# Patient Record
Sex: Female | Born: 1965 | Race: Black or African American | Hispanic: No | Marital: Married | Smoking: Never smoker
Health system: Southern US, Community
[De-identification: ages and names within clinical notes are randomized; demographics above are authoritative.]

## PROBLEM LIST (undated history)

## (undated) DIAGNOSIS — Z923 Personal history of irradiation: Secondary | ICD-10-CM

## (undated) DIAGNOSIS — R112 Nausea with vomiting, unspecified: Secondary | ICD-10-CM

## (undated) DIAGNOSIS — C801 Malignant (primary) neoplasm, unspecified: Secondary | ICD-10-CM

## (undated) DIAGNOSIS — Z9889 Other specified postprocedural states: Secondary | ICD-10-CM

## (undated) DIAGNOSIS — T8859XA Other complications of anesthesia, initial encounter: Secondary | ICD-10-CM

## (undated) DIAGNOSIS — T4145XA Adverse effect of unspecified anesthetic, initial encounter: Secondary | ICD-10-CM

## (undated) HISTORY — PX: APPENDECTOMY: SHX54

## (undated) HISTORY — PX: TUBAL LIGATION: SHX77

## (undated) HISTORY — PX: CHOLECYSTECTOMY: SHX55

---

## 2016-12-19 ENCOUNTER — Other Ambulatory Visit: Payer: Self-pay | Admitting: Specialist

## 2016-12-19 DIAGNOSIS — N6310 Unspecified lump in the right breast, unspecified quadrant: Secondary | ICD-10-CM

## 2016-12-19 DIAGNOSIS — C801 Malignant (primary) neoplasm, unspecified: Secondary | ICD-10-CM

## 2016-12-19 HISTORY — DX: Malignant (primary) neoplasm, unspecified: C80.1

## 2016-12-25 ENCOUNTER — Ambulatory Visit
Admission: RE | Admit: 2016-12-25 | Discharge: 2016-12-25 | Disposition: A | Discharge status: Home / Self Care | Payer: BLUE CROSS/BLUE SHIELD | Source: Ambulatory Visit | Attending: Specialist | Admitting: Specialist

## 2016-12-25 DIAGNOSIS — N6310 Unspecified lump in the right breast, unspecified quadrant: Secondary | ICD-10-CM

## 2017-01-06 ENCOUNTER — Ambulatory Visit: Payer: Self-pay | Admitting: Surgery

## 2017-01-06 DIAGNOSIS — Z170 Estrogen receptor positive status [ER+]: Principal | ICD-10-CM

## 2017-01-06 DIAGNOSIS — C50411 Malignant neoplasm of upper-outer quadrant of right female breast: Secondary | ICD-10-CM

## 2017-01-06 NOTE — H&P (Signed)
Katelyn Villanueva 01/06/2017 11:31 AM Location: [facility] Patient #: [id number] DOB: 05-03-1966 Married / Language: English / Race: Black or African American Female  History of Present Illness Marcello Moores A. Abimbola Aki MD; 01/06/2017 5:19 PM) Patient words: Patient said at the request of Dr. Noel Christmas for right breast cancer. Patient underwent screening mammogram which showed a 1.5 cm right breast mass upper outer quadrant. 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 This was 0 positive. A positive HER-2/neu negative with a Ki-67 of 3%. Patient had a maternal age with breast cancer. She was in her 35s when she was diagnosed. Patient has no other symptoms. Patient denies breast pain nipple discharge further problem with her breasts.                                CLINICAL DATA: Status post ultrasound-guided core needle biopsy of the 1.5 cm mass in the 10 o'clock position of the right breast.  EXAM: DIAGNOSTIC RIGHT MAMMOGRAM POST ULTRASOUND BIOPSY  COMPARISON: Previous exam(s).  FINDINGS: Mammographic images were obtained following ultrasound guided biopsy of the recently demonstrated 1.5 cm mass in the 10 o'clock position of the right breast. These demonstrate a ribbon shaped biopsy marker clip within the biopsied mass.  IMPRESSION: Appropriate clip deployment following ultrasound-guided core needle biopsy.  Final Assessment: Post Procedure Mammograms for Marker Placement   Electronically Signed By: Claudie Revering M.D. On: 12/25/2016 13:57             Patient: Katelyn Villanueva Collected: 12/25/2016 Client: [facility] Accession: [id number] Received: 12/25/2016 Claudie Revering, MD DOB: 03-19-1966 Age: 49 Gender: F Reported: 12/26/2016 [place] Patient Ph: [phone] MRN #: [id number] [place], [place] [address] Client Acc#: Chart #: [id number] Phone: [phone] Fax: CC: REPORT OF  SURGICAL PATHOLOGY ADDITIONAL INFORMATION: PROGNOSTIC INDICATORS Results: IMMUNOHISTOCHEMICAL AND MORPHOMETRIC ANALYSIS PERFORMED MANUALLY Estrogen Receptor: 90%, POSITIVE, STRONG STAINING INTENSITY Progesterone Receptor: 90%, POSITIVE, STRONG STAINING INTENSITY Proliferation Marker Ki67: 3% REFERENCE RANGE ESTROGEN RECEPTOR NEGATIVE 0% POSITIVE =>1% REFERENCE RANGE PROGESTERONE RECEPTOR NEGATIVE 0% POSITIVE =>1% All controls stained appropriately Enid Cutter MD Pathologist, Electronic Signature ( Signed 12/30/2016) FLUORESCENCE IN-SITU HYBRIDIZATION Results: HER2 - NEGATIVE RATIO OF HER2/CEP17 SIGNALS 1.38 AVERAGE HER2 COPY NUMBER PER CELL 2.55 Reference Range: NEGATIVE HER2/CEP17 Ratio <2.0 and average HER2 copy number <4.0 EQUIVOCAL HER2/CEP17 Ratio <2.0 and average HER2 copy number >=4.0 and <6.0 1 of 3 FINAL for [name], [name] ([id number]) ADDITIONAL INFORMATION:(continued) POSITIVE HER2/CEP17 Ratio >=2.0 or <2.0 and average HER2 copy number >=6.0 Enid Cutter MD Pathologist, Electronic Signature ( Signed 12/30/2016) FINAL DIAGNOSIS Diagnosis Breast, right, needle core biopsy, 10:00 o'clock - 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 Ductal carcinoma in situ, intermediate nuclear grade, cribriform pattern with calcifications is associated with the invasive component. Prognostic markers (ER/PR/ki-67/HER2-FISH) are pending and will be reported in an addendum. Dr. Lyndon Code has reviewed the case and agrees with above diagnosis. These results were called to [facility] on December 26, 2016. Thressa Sheller MD Pathologist, Electronic Signature (Case signed 12/26/2016) Specimen Gross and Clinical Information Specimen Comment In formalin: 1:15 PM; 1.5cm mass Specimen(s)  Obtained: Breast, right, needle core biopsy, 10:00 o'clock Specimen Clinical Information IMC.  The patient is a 51 year old  female.   Past Surgical History Katelyn Villanueva, [place]; 01/06/2017 11:32 AM) Breast Biopsy Right. Gallbladder Surgery - Open Oral Surgery  Diagnostic Studies History Katelyn Villanueva, [place]; 01/06/2017 11:32 AM) Colonoscopy never Mammogram within last year  Allergies Katelyn Villanueva, RMA; 01/06/2017 11:32 AM) No Known Allergies 01/06/2017  Medication History Katelyn Villanueva, RMA; 01/06/2017 11:33 AM) No Current Medications Medications Reconciled  Social History Katelyn Villanueva, [place]; 01/06/2017 11:32 AM) No alcohol use No caffeine use No drug use Tobacco use Never smoker.  Family History Katelyn Villanueva, [place]; 01/06/2017 11:32 AM) Breast Cancer Family Members In General. Hypertension Mother. Migraine Headache Mother. Seizure disorder Mother.  Pregnancy / Birth History Katelyn Villanueva, [place]; 01/06/2017 11:32 AM) Age at menarche 77 years. Contraceptive History Oral contraceptives. Gravida 3 Maternal age 63-25 Para 3 Regular periods  Other Problems Katelyn Villanueva, [place]; 01/06/2017 11:32 AM) Breast Cancer Cholelithiasis     Review of Systems Katelyn Villanueva RMA; 01/06/2017 11:32 AM) General Not Present- Appetite Loss, Chills, Fatigue, Fever, Night Sweats, Weight Gain and Weight Loss. Skin Not Present- Change in Wart/Mole, Dryness, Hives, Jaundice, New Lesions, Non-Healing Wounds, Rash and Ulcer. HEENT Not Present- Earache, Hearing Loss, Hoarseness, Nose Bleed, Oral Ulcers, Ringing in the Ears, Seasonal Allergies, Sinus Pain, Sore Throat, Visual Disturbances, Wears glasses/contact lenses and Yellow Eyes. Breast Present- Breast Mass. Not Present- Breast Pain, Nipple Discharge and Skin Changes. Cardiovascular Present- Swelling of Extremities. Not Present- Chest Pain, Difficulty Breathing Lying Down, Leg Cramps,  Palpitations, Rapid Heart Rate and Shortness of Breath. Gastrointestinal Not Present- Abdominal Pain, Bloating, Bloody Stool, Change in Bowel Habits, Chronic diarrhea, Constipation, Difficulty Swallowing, Excessive gas, Gets full quickly at meals, Hemorrhoids, Indigestion, Nausea, Rectal Pain and Vomiting. Female Genitourinary Not Present- Frequency, Nocturia, Painful Urination, Pelvic Pain and Urgency. Musculoskeletal Not Present- Back Pain, Joint Pain, Joint Stiffness, Muscle Pain, Muscle Weakness and Swelling of Extremities. Neurological Not Present- Decreased Memory, Fainting, Headaches, Numbness, Seizures, Tingling, Tremor, Trouble walking and Weakness. Psychiatric Not Present- Anxiety, Bipolar, Change in Sleep Pattern, Depression, Fearful and Frequent crying. Endocrine Not Present- Cold Intolerance, Excessive Hunger, Hair Changes, Heat Intolerance, Hot flashes and New Diabetes. Hematology Not Present- Blood Thinners, Easy Bruising, Excessive bleeding, Gland problems, HIV and Persistent Infections.  Vitals Katelyn Villanueva RMA; 01/06/2017 11:33 AM) 01/06/2017 11:33 AM Weight: 205 lb Height: 66in Body Surface Area: 2.02 m Body Mass Index: 33.09 kg/m  Temp.: 97.69F  Pulse: 100 (Regular)  BP: 160/90 (Sitting, Left Arm, Standard)      Physical Exam (Maurisio Ruddy A. Uriel Dowding MD; 01/06/2017 5:19 PM)  General Mental Status-Alert. General Appearance-Consistent with stated age. Hydration-Well hydrated. Voice-Normal.  Head and Neck Head-normocephalic, atraumatic with no lesions or palpable masses. Trachea-midline. Thyroid Gland Characteristics - normal size and consistency.  Eye Eyeball - Bilateral-Extraocular movements intact. Sclera/Conjunctiva - Bilateral-No scleral icterus.  Chest and Lung Exam Chest and lung exam reveals -quiet, even and easy respiratory effort with no use of accessory muscles and on auscultation, normal breath sounds, no adventitious  sounds and normal vocal resonance. Inspection Chest Wall - Normal. Back - normal.  Breast Breast - Left-Symmetric, Non Tender, No Biopsy scars, no Dimpling, No Inflammation, No Lumpectomy scars, No Mastectomy scars, No Peau d' Orange. Breast - Right-Symmetric, Non Tender, No Biopsy scars, no Dimpling, No Inflammation, No Lumpectomy scars, No Mastectomy scars, No Peau d' Orange. Breast Lump-No Palpable Breast Mass.  Cardiovascular Cardiovascular examination reveals -normal heart sounds, regular rate and rhythm with no murmurs and normal pedal pulses bilaterally.  Abdomen Inspection Inspection of the abdomen  reveals - No Hernias. Skin - Scar - no surgical scars. Palpation/Percussion Palpation and Percussion of the abdomen reveal - Soft, Non Tender, No Rebound tenderness, No Rigidity (guarding) and No hepatosplenomegaly. Auscultation Auscultation of the abdomen reveals - Bowel sounds normal.  Neurologic Neurologic evaluation reveals -alert and oriented x 3 with no impairment of recent or remote memory. Mental Status-Normal.  Musculoskeletal Normal Exam - Left-Upper Extremity Strength Normal and Lower Extremity Strength Normal. Normal Exam - Right-Upper Extremity Strength Normal and Lower Extremity Strength Normal.  Lymphatic Head & Neck  General Head & Neck Lymphatics: Bilateral - Description - Normal. Axillary  General Axillary Region: Bilateral - Description - Normal. Tenderness - Non Tender. Femoral & Inguinal  Generalized Femoral & Inguinal Lymphatics: Bilateral - Description - Normal. Tenderness - Non Tender.    Assessment & Plan (Tyden Kann A. Alianny Toelle MD; 01/06/2017 5:21 PM)  BREAST CANCER, RIGHT (C50.911) Impression: Discussed surgical options. Refer to medical radiation oncology Risk of lumpectomy include bleeding, infection, seroma, more surgery, use of seed/wire, wound care, cosmetic deformity and the need for other treatments, death , blood clots, death.  Pt agrees to proceed. Risk of sentinel lymph node mapping include bleeding, infection, lymphedema, shoulder pain. stiffness, dye allergy. cosmetic deformity , blood clots, death, need for more surgery. Pt agres to proceed. . Patient desires lumpectomy and we set her up for right breast partial mastectomy seed ocalization and right axillary sentinel lymph node mapping.  Current Plans We discussed the staging and pathophysiology of breast cancer. We discussed all of the different options for treatment for breast cancer including surgery, chemotherapy, radiation therapy, Herceptin, and antiestrogen therapy. We discussed a sentinel lymph node biopsy as she does not appear to having lymph node involvement right now. We discussed the performance of that with injection of radioactive tracer and blue dye. We discussed that she would have an incision underneath her axillary hairline. We discussed that there is a bout a 10-20% chance of having a positive node with a sentinel lymph node biopsy and we will await the permanent pathology to make any other first further decisions in terms of her treatment. One of these options might be to return to the operating room to perform an axillary lymph node dissection. We discussed about a 1-2% risk lifetime of chronic shoulder pain as well as lymphedema associated with a sentinel lymph node biopsy. We discussed the options for treatment of the breast cancer which included lumpectomy versus a mastectomy. We discussed the performance of the lumpectomy with a wire placement. We discussed a 10-20% chance of a positive margin requiring reexcision in the operating room. We also discussed that she may need radiation therapy or antiestrogen therapy or both if she undergoes lumpectomy. We discussed the mastectomy and the postoperative care for that as well. We discussed that there is no difference in her survival whether she undergoes lumpectomy with radiation therapy or antiestrogen  therapy versus a mastectomy. There is a slight difference in the local recurrence rate being 3-5% with lumpectomy and about 1% with a mastectomy. We discussed the risks of operation including bleeding, infection, possible reoperation. She understands her further therapy will be based on what her stages at the time of her operation.  You are being scheduled for surgery- Our schedulers will call you.  You should hear from our office's scheduling department within 5 working days about the location, date, and time of surgery. We try to make accommodations for patient's preferences in scheduling surgery, but sometimes the OR schedule  or the surgeon's schedule prevents [place] from making those accommodations.  If you have not heard from our office [phone]) in 5 working days, call the office and ask for your surgeon's nurse.  If you have other questions about your diagnosis, plan, or surgery, call the office and ask for your surgeon's nurse.  Pt Education - [facility] Breast Cancer Information Given - [facility] "Breast Journey" Package Pt Education - [facility] breast cancer surgery: discussed with patient and provided information. Pt Education - [facility] (After Breast Cancer) Class Info: discussed with patient and provided information.

## 2017-01-08 ENCOUNTER — Other Ambulatory Visit: Payer: Self-pay | Admitting: Surgery

## 2017-01-08 DIAGNOSIS — C50411 Malignant neoplasm of upper-outer quadrant of right female breast: Secondary | ICD-10-CM

## 2017-01-08 DIAGNOSIS — Z170 Estrogen receptor positive status [ER+]: Principal | ICD-10-CM

## 2017-01-09 ENCOUNTER — Encounter: Payer: Self-pay | Admitting: Radiation Oncology

## 2017-01-12 ENCOUNTER — Other Ambulatory Visit: Payer: Self-pay | Admitting: Oncology

## 2017-01-12 ENCOUNTER — Encounter (HOSPITAL_BASED_OUTPATIENT_CLINIC_OR_DEPARTMENT_OTHER): Payer: Self-pay | Admitting: *Deleted

## 2017-01-13 ENCOUNTER — Encounter: Payer: Self-pay | Admitting: Hematology and Oncology

## 2017-01-13 ENCOUNTER — Encounter (HOSPITAL_BASED_OUTPATIENT_CLINIC_OR_DEPARTMENT_OTHER)
Admission: RE | Admit: 2017-01-13 | Discharge: 2017-01-13 | Disposition: A | Discharge status: Home / Self Care | Payer: BLUE CROSS/BLUE SHIELD | Source: Ambulatory Visit | Attending: Surgery | Admitting: Surgery

## 2017-01-13 ENCOUNTER — Telehealth: Payer: Self-pay | Admitting: Hematology and Oncology

## 2017-01-13 LAB — COMPREHENSIVE METABOLIC PANEL
ALT: 23 U/L (ref 14–54)
ANION GAP: 4 — AB (ref 5–15)
AST: 18 U/L (ref 15–41)
Albumin: 3.9 g/dL (ref 3.5–5.0)
Alkaline Phosphatase: 65 U/L (ref 38–126)
BILIRUBIN TOTAL: 0.9 mg/dL (ref 0.3–1.2)
BUN: 8 mg/dL (ref 6–20)
CO2: 28 mmol/L (ref 22–32)
Calcium: 10.7 mg/dL — ABNORMAL HIGH (ref 8.9–10.3)
Chloride: 104 mmol/L (ref 101–111)
Creatinine, Ser: 0.63 mg/dL (ref 0.44–1.00)
GFR calc Af Amer: >60 mL/min (ref >60)
Glucose, Bld: 101 mg/dL — ABNORMAL HIGH (ref 65–99)
POTASSIUM: 4.8 mmol/L (ref 3.5–5.1)
Sodium: 136 mmol/L (ref 135–145)
TOTAL PROTEIN: 7.1 g/dL (ref 6.5–8.1)

## 2017-01-13 LAB — CBC WITH DIFFERENTIAL/PLATELET
Basophils Absolute: 0 10*3/uL (ref 0.0–0.1)
Basophils Relative: 0 %
Eosinophils Absolute: 0.2 10*3/uL (ref 0.0–0.7)
Eosinophils Relative: 3 %
HEMATOCRIT: 41.5 % (ref 36.0–46.0)
Hemoglobin: 13.8 g/dL (ref 12.0–15.0)
LYMPHS PCT: 54 %
Lymphs Abs: 3.7 10*3/uL (ref 0.7–4.0)
MCH: 29.1 pg (ref 26.0–34.0)
MCHC: 33.3 g/dL (ref 30.0–36.0)
MCV: 87.4 fL (ref 78.0–100.0)
MONO ABS: 0.6 10*3/uL (ref 0.1–1.0)
MONOS PCT: 9 %
NEUTROS ABS: 2.4 10*3/uL (ref 1.7–7.7)
Neutrophils Relative %: 34 %
Platelets: 239 10*3/uL (ref 150–400)
RBC: 4.75 MIL/uL (ref 3.87–5.11)
RDW: 13 % (ref 11.5–15.5)
WBC: 6.9 10*3/uL (ref 4.0–10.5)

## 2017-01-13 NOTE — Progress Notes (Signed)
Boost drink given with instructions to complete by 0400 dos, pt verbalized understanding.

## 2017-01-13 NOTE — Telephone Encounter (Signed)
Appt has been scheduled for the pt to see Dr. Lindi Adie on 7/9 at 345pm. Pt agreed to the appt date and time. Demographics verified. Letter mailed to the pt.

## 2017-01-15 ENCOUNTER — Ambulatory Visit
Admission: RE | Admit: 2017-01-15 | Discharge: 2017-01-15 | Disposition: A | Discharge status: Home / Self Care | Payer: BLUE CROSS/BLUE SHIELD | Source: Ambulatory Visit | Attending: Surgery | Admitting: Surgery

## 2017-01-15 DIAGNOSIS — C50411 Malignant neoplasm of upper-outer quadrant of right female breast: Secondary | ICD-10-CM

## 2017-01-15 DIAGNOSIS — Z170 Estrogen receptor positive status [ER+]: Principal | ICD-10-CM

## 2017-01-18 HISTORY — PX: BREAST LUMPECTOMY: SHX2

## 2017-01-19 ENCOUNTER — Encounter (HOSPITAL_BASED_OUTPATIENT_CLINIC_OR_DEPARTMENT_OTHER)
Admission: RE | Disposition: A | Discharge status: Home / Self Care | Payer: Self-pay | Source: Ambulatory Visit | Attending: Surgery

## 2017-01-19 ENCOUNTER — Ambulatory Visit (HOSPITAL_BASED_OUTPATIENT_CLINIC_OR_DEPARTMENT_OTHER): Payer: BLUE CROSS/BLUE SHIELD | Admitting: Anesthesiology

## 2017-01-19 ENCOUNTER — Encounter (HOSPITAL_BASED_OUTPATIENT_CLINIC_OR_DEPARTMENT_OTHER): Payer: Self-pay

## 2017-01-19 ENCOUNTER — Ambulatory Visit
Admission: RE | Admit: 2017-01-19 | Discharge: 2017-01-19 | Disposition: A | Discharge status: Home / Self Care | Payer: BLUE CROSS/BLUE SHIELD | Source: Ambulatory Visit | Attending: Surgery | Admitting: Surgery

## 2017-01-19 ENCOUNTER — Encounter (HOSPITAL_COMMUNITY)
Admission: RE | Admit: 2017-01-19 | Discharge: 2017-01-19 | Disposition: A | Discharge status: Home / Self Care | Payer: BLUE CROSS/BLUE SHIELD | Source: Ambulatory Visit | Attending: Surgery | Admitting: Surgery

## 2017-01-19 ENCOUNTER — Ambulatory Visit (HOSPITAL_BASED_OUTPATIENT_CLINIC_OR_DEPARTMENT_OTHER)
Admission: RE | Admit: 2017-01-19 | Discharge: 2017-01-19 | Disposition: A | Discharge status: Home / Self Care | Payer: BLUE CROSS/BLUE SHIELD | Source: Ambulatory Visit | Attending: Surgery | Admitting: Surgery

## 2017-01-19 DIAGNOSIS — Z170 Estrogen receptor positive status [ER+]: Principal | ICD-10-CM

## 2017-01-19 DIAGNOSIS — C50411 Malignant neoplasm of upper-outer quadrant of right female breast: Secondary | ICD-10-CM

## 2017-01-19 DIAGNOSIS — Z803 Family history of malignant neoplasm of breast: Secondary | ICD-10-CM | POA: Diagnosis not present

## 2017-01-19 HISTORY — DX: Other complications of anesthesia, initial encounter: T88.59XA

## 2017-01-19 HISTORY — DX: Other specified postprocedural states: R11.2

## 2017-01-19 HISTORY — DX: Malignant (primary) neoplasm, unspecified: C80.1

## 2017-01-19 HISTORY — PX: RADIOACTIVE SEED GUIDED PARTIAL MASTECTOMY WITH AXILLARY SENTINEL LYMPH NODE BIOPSY: SHX6520

## 2017-01-19 HISTORY — DX: Adverse effect of unspecified anesthetic, initial encounter: T41.45XA

## 2017-01-19 HISTORY — DX: Other specified postprocedural states: Z98.890

## 2017-01-19 SURGERY — RADIOACTIVE SEED GUIDED PARTIAL MASTECTOMY WITH AXILLARY SENTINEL LYMPH NODE BIOPSY
Anesthesia: General | Site: Breast | Laterality: Right

## 2017-01-19 MED ORDER — CELECOXIB 400 MG PO CAPS
400.0000 mg | ORAL_CAPSULE | ORAL | Status: AC
  Administered 2017-01-19: 400 mg via ORAL

## 2017-01-19 MED ORDER — CEFAZOLIN SODIUM-DEXTROSE 2-4 GM/100ML-% IV SOLN
INTRAVENOUS | Status: AC
  Filled 2017-01-19: qty 100

## 2017-01-19 MED ORDER — FENTANYL CITRATE (PF) 100 MCG/2ML IJ SOLN
INTRAMUSCULAR | Status: AC
  Filled 2017-01-19: qty 2

## 2017-01-19 MED ORDER — BUPIVACAINE-EPINEPHRINE (PF) 0.5% -1:200000 IJ SOLN
INTRAMUSCULAR | Status: DC | PRN
  Administered 2017-01-19: 30 mL

## 2017-01-19 MED ORDER — MIDAZOLAM HCL 2 MG/2ML IJ SOLN
1.0000 mg | INTRAMUSCULAR | Status: DC | PRN
  Administered 2017-01-19: 2 mg via INTRAVENOUS

## 2017-01-19 MED ORDER — TECHNETIUM TC 99M SULFUR COLLOID FILTERED
1.0000 | Freq: Once | INTRAVENOUS | Status: AC | PRN
  Administered 2017-01-19: 1 via INTRADERMAL

## 2017-01-19 MED ORDER — CHLORHEXIDINE GLUCONATE CLOTH 2 % EX PADS: 6.0000 | MEDICATED_PAD | Freq: Once | CUTANEOUS | Status: DC

## 2017-01-19 MED ORDER — FENTANYL CITRATE (PF) 100 MCG/2ML IJ SOLN
50.0000 ug | INTRAMUSCULAR | Status: DC | PRN
  Administered 2017-01-19: 100 ug via INTRAVENOUS
  Administered 2017-01-19: 25 ug via INTRAVENOUS

## 2017-01-19 MED ORDER — HYDROMORPHONE HCL 1 MG/ML IJ SOLN: 0.2500 mg | INTRAMUSCULAR | Status: DC | PRN

## 2017-01-19 MED ORDER — CELECOXIB 200 MG PO CAPS
ORAL_CAPSULE | ORAL | Status: AC
  Filled 2017-01-19: qty 2

## 2017-01-19 MED ORDER — LACTATED RINGERS IV SOLN
INTRAVENOUS | Status: DC
  Administered 2017-01-19: 07:00:00 via INTRAVENOUS

## 2017-01-19 MED ORDER — OXYCODONE HCL 5 MG PO TABS: 5.0000 mg | ORAL_TABLET | Freq: Four times a day (QID) | ORAL | 0 refills | Status: DC | PRN

## 2017-01-19 MED ORDER — 0.9 % SODIUM CHLORIDE (POUR BTL) OPTIME
TOPICAL | Status: DC | PRN
  Administered 2017-01-19: 1000 mL

## 2017-01-19 MED ORDER — LIDOCAINE HCL (CARDIAC) 20 MG/ML IV SOLN
INTRAVENOUS | Status: DC | PRN
  Administered 2017-01-19: 50 mg via INTRAVENOUS

## 2017-01-19 MED ORDER — PROPOFOL 10 MG/ML IV BOLUS
INTRAVENOUS | Status: DC | PRN
  Administered 2017-01-19: 200 mg via INTRAVENOUS

## 2017-01-19 MED ORDER — ONDANSETRON HCL 4 MG/2ML IJ SOLN
INTRAMUSCULAR | Status: DC | PRN
  Administered 2017-01-19: 4 mg via INTRAVENOUS

## 2017-01-19 MED ORDER — BUPIVACAINE-EPINEPHRINE (PF) 0.25% -1:200000 IJ SOLN
INTRAMUSCULAR | Status: DC | PRN
  Administered 2017-01-19: 15 mL

## 2017-01-19 MED ORDER — SODIUM CHLORIDE 0.9 % IJ SOLN
INTRAMUSCULAR | Status: AC
  Filled 2017-01-19: qty 10

## 2017-01-19 MED ORDER — METHYLENE BLUE 0.5 % INJ SOLN
INTRAVENOUS | Status: DC | PRN
  Administered 2017-01-19: 5 mL via INTRAMUSCULAR

## 2017-01-19 MED ORDER — PROPOFOL 10 MG/ML IV BOLUS
INTRAVENOUS | Status: AC
  Filled 2017-01-19: qty 20

## 2017-01-19 MED ORDER — GABAPENTIN 300 MG PO CAPS
ORAL_CAPSULE | ORAL | Status: AC
  Filled 2017-01-19: qty 1

## 2017-01-19 MED ORDER — METHYLENE BLUE 0.5 % INJ SOLN
INTRAVENOUS | Status: AC
  Filled 2017-01-19: qty 10

## 2017-01-19 MED ORDER — MIDAZOLAM HCL 2 MG/2ML IJ SOLN
INTRAMUSCULAR | Status: AC
  Filled 2017-01-19: qty 2

## 2017-01-19 MED ORDER — HEMOSTATIC AGENTS (NO CHARGE) OPTIME
TOPICAL | Status: DC | PRN
  Administered 2017-01-19: 1 via TOPICAL

## 2017-01-19 MED ORDER — ACETAMINOPHEN 500 MG PO TABS
ORAL_TABLET | ORAL | Status: AC
  Filled 2017-01-19: qty 2

## 2017-01-19 MED ORDER — ACETAMINOPHEN 500 MG PO TABS
1000.0000 mg | ORAL_TABLET | ORAL | Status: AC
  Administered 2017-01-19: 1000 mg via ORAL

## 2017-01-19 MED ORDER — GABAPENTIN 300 MG PO CAPS
300.0000 mg | ORAL_CAPSULE | ORAL | Status: AC
  Administered 2017-01-19: 300 mg via ORAL

## 2017-01-19 MED ORDER — ONDANSETRON HCL 4 MG/2ML IJ SOLN: 4.0000 mg | Freq: Once | INTRAMUSCULAR | Status: DC | PRN

## 2017-01-19 MED ORDER — DEXTROSE 5 % IV SOLN
3.0000 g | INTRAVENOUS | Status: AC
  Administered 2017-01-19: 2 g via INTRAVENOUS

## 2017-01-19 MED ORDER — DEXAMETHASONE SODIUM PHOSPHATE 4 MG/ML IJ SOLN
INTRAMUSCULAR | Status: DC | PRN
  Administered 2017-01-19: 10 mg via INTRAVENOUS

## 2017-01-19 MED ORDER — IBUPROFEN 800 MG PO TABS: 800.0000 mg | ORAL_TABLET | Freq: Three times a day (TID) | ORAL | 0 refills | Status: DC | PRN

## 2017-01-19 MED ORDER — BUPIVACAINE-EPINEPHRINE (PF) 0.25% -1:200000 IJ SOLN
INTRAMUSCULAR | Status: AC
  Filled 2017-01-19: qty 30

## 2017-01-19 MED ORDER — MEPERIDINE HCL 25 MG/ML IJ SOLN: 6.2500 mg | INTRAMUSCULAR | Status: DC | PRN

## 2017-01-19 MED ORDER — SCOPOLAMINE 1 MG/3DAYS TD PT72: 1.0000 | MEDICATED_PATCH | Freq: Once | TRANSDERMAL | Status: DC | PRN

## 2017-01-19 SURGICAL SUPPLY — 41 items
APPLIER CLIP 9.375 MED OPEN (MISCELLANEOUS) ×3
BINDER BREAST XLRG (GAUZE/BANDAGES/DRESSINGS) ×3 IMPLANT
BLADE SURG 15 STRL LF DISP TIS (BLADE) ×1 IMPLANT
BLADE SURG 15 STRL SS (BLADE) ×2
CANISTER SUCT 1200ML W/VALVE (MISCELLANEOUS) ×3 IMPLANT
CHLORAPREP W/TINT 26ML (MISCELLANEOUS) ×3 IMPLANT
CLIP APPLIE 9.375 MED OPEN (MISCELLANEOUS) ×1 IMPLANT
COVER BACK TABLE 60X90IN (DRAPES) ×3 IMPLANT
COVER MAYO STAND STRL (DRAPES) ×3 IMPLANT
COVER PROBE W GEL 5X96 (DRAPES) ×3 IMPLANT
DERMABOND ADVANCED (GAUZE/BANDAGES/DRESSINGS) ×2
DERMABOND ADVANCED .7 DNX12 (GAUZE/BANDAGES/DRESSINGS) ×1 IMPLANT
DEVICE DUBIN W/COMP PLATE 8390 (MISCELLANEOUS) ×3 IMPLANT
DRAPE LAPAROSCOPIC ABDOMINAL (DRAPES) ×3 IMPLANT
DRAPE UTILITY XL STRL (DRAPES) ×3 IMPLANT
ELECT COATED BLADE 2.86 ST (ELECTRODE) ×3 IMPLANT
ELECT REM PT RETURN 9FT ADLT (ELECTROSURGICAL) ×3
ELECTRODE REM PT RTRN 9FT ADLT (ELECTROSURGICAL) ×1 IMPLANT
GLOVE BIOGEL PI IND STRL 8 (GLOVE) ×1 IMPLANT
GLOVE BIOGEL PI INDICATOR 8 (GLOVE) ×2
GLOVE ECLIPSE 8.0 STRL XLNG CF (GLOVE) ×3 IMPLANT
GOWN STRL REUS W/ TWL LRG LVL3 (GOWN DISPOSABLE) ×2 IMPLANT
GOWN STRL REUS W/TWL LRG LVL3 (GOWN DISPOSABLE) ×4
HEMOSTAT SNOW SURGICEL 2X4 (HEMOSTASIS) ×6 IMPLANT
KIT MARKER MARGIN INK (KITS) ×3 IMPLANT
NDL SAFETY ECLIPSE 18X1.5 (NEEDLE) ×1 IMPLANT
NEEDLE HYPO 18GX1.5 SHARP (NEEDLE) ×2
NEEDLE HYPO 25X1 1.5 SAFETY (NEEDLE) ×6 IMPLANT
NS IRRIG 1000ML POUR BTL (IV SOLUTION) ×3 IMPLANT
PACK BASIN DAY SURGERY FS (CUSTOM PROCEDURE TRAY) ×3 IMPLANT
PENCIL BUTTON HOLSTER BLD 10FT (ELECTRODE) ×3 IMPLANT
SLEEVE SCD COMPRESS KNEE MED (MISCELLANEOUS) ×3 IMPLANT
SPONGE LAP 4X18 X RAY DECT (DISPOSABLE) ×3 IMPLANT
SUT MNCRL AB 4-0 PS2 18 (SUTURE) ×3 IMPLANT
SUT VICRYL 3-0 CR8 SH (SUTURE) ×3 IMPLANT
SYR CONTROL 10ML LL (SYRINGE) ×3 IMPLANT
TOWEL OR 17X24 6PK STRL BLUE (TOWEL DISPOSABLE) ×3 IMPLANT
TOWEL OR NON WOVEN STRL DISP B (DISPOSABLE) ×3 IMPLANT
TUBE CONNECTING 20'X1/4 (TUBING) ×1
TUBE CONNECTING 20X1/4 (TUBING) ×2 IMPLANT
YANKAUER SUCT BULB TIP NO VENT (SUCTIONS) ×3 IMPLANT

## 2017-01-19 NOTE — Anesthesia Postprocedure Evaluation (Signed)
Anesthesia Post Note  Patient: Katelyn Villanueva  Procedure(s) Performed: Procedure(s) (LRB): RIGHT BREAST RADIOACTIVE SEED GUIDED PARTIAL MASTECTOMY WITH RIGHT  SENTINEL LYMPH NODE MAPPING (Right)     Patient location during evaluation: PACU Anesthesia Type: General Level of consciousness: awake and alert Pain management: pain level controlled Vital Signs Assessment: post-procedure vital signs reviewed and stable Respiratory status: spontaneous breathing, nonlabored ventilation, respiratory function stable and patient connected to nasal cannula oxygen Cardiovascular status: blood pressure returned to baseline and stable Postop Assessment: no signs of nausea or vomiting Anesthetic complications: no    Last Vitals:  Vitals:   01/19/17 0930 01/19/17 0959  BP: (!) 141/88 (!) 141/80  Pulse: 94 86  Resp: 17 18  Temp:  36.6 C    Last Pain:  Vitals:   01/19/17 0959  TempSrc: Oral  PainSc: 0-No pain                 Canary Fister DAVID

## 2017-01-19 NOTE — Op Note (Signed)
Preoperative diagnosis: Stage I right breast cancer  Postoperative diagnosis: Same  Procedure: Right breast seed localized partial mastectomy and right axillary sentinel lymph node mapping with methylene blue dye (deep )  Surgeon Erroll Luna M.D.  Anesthesia: LMA with pectoral block of local anesthetic  EBL: Minimal  Specimen: Right breast mass with seed Endo Clip specimen plus additional anterior margin plus to right axillary sentinel nodes level I  Drains: None  Indications for procedure: The patient presents for right breast partial mastectomy for stage I right breast cancer. She was evaluated all of her options were discussed. Risks, benefits and other treatment options were discussed.The procedure has been discussed with the patient. Alternatives to surgery have been discussed with the patient.  Risks of surgery include bleeding,  Infection,  Seroma formation, death,  and the need for further surgery.   The patient understands and wishes to proceed.Sentinel lymph node mapping and dissection has been discussed with the patient.  Risk of bleeding,  Infection,  Seroma formation,  Additional procedures,,  Shoulder weakness ,  Shoulder stiffness,  Nerve and blood vessel injury and reaction to the mapping dyes have been discussed.  Alternatives to surgery have been discussed with the patient.  The patient agrees to proceed.   Description of procedure: The patient was met in the holding area. The right side was marked as the correct side. Pectoral block placed by anesthesia. All questions were answered. She was taken back to the operating room and placed upon the OR table. After induction of LMA anesthesia, the right breast was prepped and draped in a sterile fashion and timeout was done. She received antibiotics. Neoprobe was used and the seed was identified in the right breast upper-outer quadrant. Incision was made in the inferior axillary hairline of the right axilla. Dissection was carried  out and all tissue around the seating clip were excised with a grossly negative margin. I felt the anterior margin was close therefore I took an additional anterior margin. Hemostasis achieved. Through this same incision 7 no is identified. 4 mL of methylene blue dye were injected in a subareolar position and massage. This was 5 minutes. Neoprobe was used to identify 2 sentinel nodes which were both hot and one being blue. These removed. These were deep axillary nodes level I. Hemostasis achieved. Background counts approached 0. We was irrigated. Surgicel snow was placed in the axilla. Incision was closed with 3-0 Vicryl for Monocryl. Dermabond applied. Binder placed. All final counts found to be correct sponge, needles and instruments. Patient awoke and was extubated taken recovery in satisfactory condition.

## 2017-01-19 NOTE — Anesthesia Procedure Notes (Signed)
Anesthesia Regional Block: Pectoralis block   Pre-Anesthetic Checklist: ,, timeout performed, Correct Patient, Correct Site, Correct Laterality, Correct Procedure, Correct Position, site marked, Risks and benefits discussed,  Surgical consent,  Pre-op evaluation,  At surgeon's request and post-op pain management  Laterality: Right  Prep: chloraprep       Needles:  Injection technique: Single-shot     Needle Length: 9cm  Needle Gauge: 21     Additional Needles:   Procedures: ultrasound guided,,,,,,,,  Narrative:  Start time: 01/19/2017 7:00 AM End time: 01/19/2017 7:10 AM Injection made incrementally with aspirations every 5 mL.  Performed by: Personally  Anesthesiologist: Lillia Abed  Additional Notes: Monitors applied. Patient sedated. Sterile prep and drape,hand hygiene and sterile gloves were used. Relevant anatomy identified.Needle position confirmed.Local anesthetic injected incrementally after negative aspiration. Local anesthetic spread visualized. Vascular puncture avoided. No complications. Image printed for medical record.The patient tolerated the procedure well.

## 2017-01-19 NOTE — H&P (View-Only) (Signed)
Katelyn Villanueva 01/06/2017 11:31 AM Location: [facility] Patient #: [id number] DOB: 1966-04-23 Married / Language: English / Race: Black or African American Female  History of Present Illness Marcello Moores A. Mykela Mewborn MD; 01/06/2017 5:19 PM) Patient words: Patient said at the request of Dr. Noel Christmas for right breast cancer. Patient underwent screening mammogram which showed a 1.5 cm right breast mass upper outer quadrant. 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 This was 0 positive. A positive HER-2/neu negative with a Ki-67 of 3%. Patient had a maternal age with breast cancer. She was in her 34s when she was diagnosed. Patient has no other symptoms. Patient denies breast pain nipple discharge further problem with her breasts.                                CLINICAL DATA: Status post ultrasound-guided core needle biopsy of the 1.5 cm mass in the 10 o'clock position of the right breast.  EXAM: DIAGNOSTIC RIGHT MAMMOGRAM POST ULTRASOUND BIOPSY  COMPARISON: Previous exam(s).  FINDINGS: Mammographic images were obtained following ultrasound guided biopsy of the recently demonstrated 1.5 cm mass in the 10 o'clock position of the right breast. These demonstrate a ribbon shaped biopsy marker clip within the biopsied mass.  IMPRESSION: Appropriate clip deployment following ultrasound-guided core needle biopsy.  Final Assessment: Post Procedure Mammograms for Marker Placement   Electronically Signed By: Claudie Revering M.D. On: 12/25/2016 13:57             Patient: Katelyn Villanueva, Katelyn Villanueva Collected: 12/25/2016 Client: [facility] Accession: [id number] Received: 12/25/2016 Claudie Revering, MD DOB: 10-30-1965 Age: 51 Gender: F Reported: 12/26/2016 [place] Patient Ph: [phone] MRN #: [id number] [place], [place] [address] Client Acc#: Chart #: [id number] Phone: [phone] Fax: CC: REPORT OF  SURGICAL PATHOLOGY ADDITIONAL INFORMATION: PROGNOSTIC INDICATORS Results: IMMUNOHISTOCHEMICAL AND MORPHOMETRIC ANALYSIS PERFORMED MANUALLY Estrogen Receptor: 90%, POSITIVE, STRONG STAINING INTENSITY Progesterone Receptor: 90%, POSITIVE, STRONG STAINING INTENSITY Proliferation Marker Ki67: 3% REFERENCE RANGE ESTROGEN RECEPTOR NEGATIVE 0% POSITIVE =>1% REFERENCE RANGE PROGESTERONE RECEPTOR NEGATIVE 0% POSITIVE =>1% All controls stained appropriately Enid Cutter MD Pathologist, Electronic Signature ( Signed 12/30/2016) FLUORESCENCE IN-SITU HYBRIDIZATION Results: HER2 - NEGATIVE RATIO OF HER2/CEP17 SIGNALS 1.38 AVERAGE HER2 COPY NUMBER PER CELL 2.55 Reference Range: NEGATIVE HER2/CEP17 Ratio <2.0 and average HER2 copy number <4.0 EQUIVOCAL HER2/CEP17 Ratio <2.0 and average HER2 copy number >=4.0 and <6.0 1 of 3 FINAL for Katelyn Villanueva, Katelyn Villanueva ([id number]) ADDITIONAL INFORMATION:(continued) POSITIVE HER2/CEP17 Ratio >=2.0 or <2.0 and average HER2 copy number >=6.0 Enid Cutter MD Pathologist, Electronic Signature ( Signed 12/30/2016) FINAL DIAGNOSIS Diagnosis Breast, right, needle core biopsy, 10:00 o'clock - 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 Ductal carcinoma in situ, intermediate nuclear grade, cribriform pattern with calcifications is associated with the invasive component. Prognostic markers (ER/PR/ki-67/HER2-FISH) are pending and will be reported in an addendum. Dr. Lyndon Code has reviewed the case and agrees with above diagnosis. These results were called to [facility] on December 26, 2016. Thressa Sheller MD Pathologist, Electronic Signature (Case signed 12/26/2016) Specimen Gross and Clinical Information Specimen Comment In formalin: 1:15 PM; 1.5cm mass Specimen(s)  Obtained: Breast, right, needle core biopsy, 10:00 o'clock Specimen Clinical Information IMC.  The patient is a 51 year old  female.   Past Surgical History [name], [place]; 01/06/2017 11:32 AM) Breast Biopsy Right. Gallbladder Surgery - Open Oral Surgery  Diagnostic Studies History [name], [place]; 01/06/2017 11:32 AM) Colonoscopy never Mammogram within last year  Allergies [name], RMA; 01/06/2017 11:32 AM) No Known Allergies 01/06/2017  Medication History [name], RMA; 01/06/2017 11:33 AM) No Current Medications Medications Reconciled  Social History [name], [place]; 01/06/2017 11:32 AM) No alcohol use No caffeine use No drug use Tobacco use Never smoker.  Family History [name], [place]; 01/06/2017 11:32 AM) Breast Cancer Family Members In General. Hypertension Mother. Migraine Headache Mother. Seizure disorder Mother.  Pregnancy / Birth History [name], [place]; 01/06/2017 11:32 AM) Age at menarche 89 years. Contraceptive History Oral contraceptives. Gravida 3 Maternal age 76-25 Para 3 Regular periods  Other Problems [name], [place]; 01/06/2017 11:32 AM) Breast Cancer Cholelithiasis     Review of Systems [name] RMA; 01/06/2017 11:32 AM) General Not Present- Appetite Loss, Chills, Fatigue, Fever, Night Sweats, Weight Gain and Weight Loss. Skin Not Present- Change in Wart/Mole, Dryness, Hives, Jaundice, New Lesions, Non-Healing Wounds, Rash and Ulcer. HEENT Not Present- Earache, Hearing Loss, Hoarseness, Nose Bleed, Oral Ulcers, Ringing in the Ears, Seasonal Allergies, Sinus Pain, Sore Throat, Visual Disturbances, Wears glasses/contact lenses and Yellow Eyes. Breast Present- Breast Mass. Not Present- Breast Pain, Nipple Discharge and Skin Changes. Cardiovascular Present- Swelling of Extremities. Not Present- Chest Pain, Difficulty Breathing Lying Down, Leg Cramps,  Palpitations, Rapid Heart Rate and Shortness of Breath. Gastrointestinal Not Present- Abdominal Pain, Bloating, Bloody Stool, Change in Bowel Habits, Chronic diarrhea, Constipation, Difficulty Swallowing, Excessive gas, Gets full quickly at meals, Hemorrhoids, Indigestion, Nausea, Rectal Pain and Vomiting. Female Genitourinary Not Present- Frequency, Nocturia, Painful Urination, Pelvic Pain and Urgency. Musculoskeletal Not Present- Back Pain, Joint Pain, Joint Stiffness, Muscle Pain, Muscle Weakness and Swelling of Extremities. Neurological Not Present- Decreased Memory, Fainting, Headaches, Numbness, Seizures, Tingling, Tremor, Trouble walking and Weakness. Psychiatric Not Present- Anxiety, Bipolar, Change in Sleep Pattern, Depression, Fearful and Frequent crying. Endocrine Not Present- Cold Intolerance, Excessive Hunger, Hair Changes, Heat Intolerance, Hot flashes and New Diabetes. Hematology Not Present- Blood Thinners, Easy Bruising, Excessive bleeding, Gland problems, HIV and Persistent Infections.  Vitals [name] RMA; 01/06/2017 11:33 AM) 01/06/2017 11:33 AM Weight: 205 lb Height: 66in Body Surface Area: 2.02 m Body Mass Index: 33.09 kg/m  Temp.: 97.33F  Pulse: 100 (Regular)  BP: 160/90 (Sitting, Left Arm, Standard)      Physical Exam (William Schake A. Herron Fero MD; 01/06/2017 5:19 PM)  General Mental Status-Alert. General Appearance-Consistent with stated age. Hydration-Well hydrated. Voice-Normal.  Head and Neck Head-normocephalic, atraumatic with no lesions or palpable masses. Trachea-midline. Thyroid Gland Characteristics - normal size and consistency.  Eye Eyeball - Bilateral-Extraocular movements intact. Sclera/Conjunctiva - Bilateral-No scleral icterus.  Chest and Lung Exam Chest and lung exam reveals -quiet, even and easy respiratory effort with no use of accessory muscles and on auscultation, normal breath sounds, no adventitious  sounds and normal vocal resonance. Inspection Chest Wall - Normal. Back - normal.  Breast Breast - Left-Symmetric, Non Tender, No Biopsy scars, no Dimpling, No Inflammation, No Lumpectomy scars, No Mastectomy scars, No Peau d' Orange. Breast - Right-Symmetric, Non Tender, No Biopsy scars, no Dimpling, No Inflammation, No Lumpectomy scars, No Mastectomy scars, No Peau d' Orange. Breast Lump-No Palpable Breast Mass.  Cardiovascular Cardiovascular examination reveals -normal heart sounds, regular rate and rhythm with no murmurs and normal pedal pulses bilaterally.  Abdomen Inspection Inspection of the abdomen  reveals - No Hernias. Skin - Scar - no surgical scars. Palpation/Percussion Palpation and Percussion of the abdomen reveal - Soft, Non Tender, No Rebound tenderness, No Rigidity (guarding) and No hepatosplenomegaly. Auscultation Auscultation of the abdomen reveals - Bowel sounds normal.  Neurologic Neurologic evaluation reveals -alert and oriented x 3 with no impairment of recent or remote memory. Mental Status-Normal.  Musculoskeletal Normal Exam - Left-Upper Extremity Strength Normal and Lower Extremity Strength Normal. Normal Exam - Right-Upper Extremity Strength Normal and Lower Extremity Strength Normal.  Lymphatic Head & Neck  General Head & Neck Lymphatics: Bilateral - Description - Normal. Axillary  General Axillary Region: Bilateral - Description - Normal. Tenderness - Non Tender. Femoral & Inguinal  Generalized Femoral & Inguinal Lymphatics: Bilateral - Description - Normal. Tenderness - Non Tender.    Assessment & Plan (Irania Durell A. Charly Holcomb MD; 01/06/2017 5:21 PM)  BREAST CANCER, RIGHT (C50.911) Impression: Discussed surgical options. Refer to medical radiation oncology Risk of lumpectomy include bleeding, infection, seroma, more surgery, use of seed/wire, wound care, cosmetic deformity and the need for other treatments, death , blood clots, death.  Pt agrees to proceed. Risk of sentinel lymph node mapping include bleeding, infection, lymphedema, shoulder pain. stiffness, dye allergy. cosmetic deformity , blood clots, death, need for more surgery. Pt agres to proceed. . Patient desires lumpectomy and we set her up for right breast partial mastectomy seed ocalization and right axillary sentinel lymph node mapping.  Current Plans We discussed the staging and pathophysiology of breast cancer. We discussed all of the different options for treatment for breast cancer including surgery, chemotherapy, radiation therapy, Herceptin, and antiestrogen therapy. We discussed a sentinel lymph node biopsy as she does not appear to having lymph node involvement right now. We discussed the performance of that with injection of radioactive tracer and blue dye. We discussed that she would have an incision underneath her axillary hairline. We discussed that there is a bout a 10-20% chance of having a positive node with a sentinel lymph node biopsy and we will await the permanent pathology to make any other first further decisions in terms of her treatment. One of these options might be to return to the operating room to perform an axillary lymph node dissection. We discussed about a 1-2% risk lifetime of chronic shoulder pain as well as lymphedema associated with a sentinel lymph node biopsy. We discussed the options for treatment of the breast cancer which included lumpectomy versus a mastectomy. We discussed the performance of the lumpectomy with a wire placement. We discussed a 10-20% chance of a positive margin requiring reexcision in the operating room. We also discussed that she may need radiation therapy or antiestrogen therapy or both if she undergoes lumpectomy. We discussed the mastectomy and the postoperative care for that as well. We discussed that there is no difference in her survival whether she undergoes lumpectomy with radiation therapy or antiestrogen  therapy versus a mastectomy. There is a slight difference in the local recurrence rate being 3-5% with lumpectomy and about 1% with a mastectomy. We discussed the risks of operation including bleeding, infection, possible reoperation. She understands her further therapy will be based on what her stages at the time of her operation.  You are being scheduled for surgery- Our schedulers will call you.  You should hear from our office's scheduling department within 5 working days about the location, date, and time of surgery. We try to make accommodations for patient's preferences in scheduling surgery, but sometimes the OR schedule  or the surgeon's schedule prevents [place] from making those accommodations.  If you have not heard from our office [phone]) in 5 working days, call the office and ask for your surgeon's nurse.  If you have other questions about your diagnosis, plan, or surgery, call the office and ask for your surgeon's nurse.  Pt Education - [facility] Breast Cancer Information Given - [facility] "Breast Journey" Package Pt Education - flb breast cancer surgery: discussed with patient and provided information. Pt Education - ABC (After Breast Cancer) Class Info: discussed with patient and provided information.

## 2017-01-19 NOTE — Interval H&P Note (Signed)
History and Physical Interval Note:  01/19/2017 7:12 AM  Katelyn Villanueva  has presented today for surgery, with the diagnosis of right breast cancer  The various methods of treatment have been discussed with the patient and family. After consideration of risks, benefits and other options for treatment, the patient has consented to  Procedure(s): RIGHT BREAST RADIOACTIVE SEED GUIDED PARTIAL MASTECTOMY WITH RIGHT  SENTINEL LYMPH NODE MAPPING (Right) as a surgical intervention .  The patient's history has been reviewed, patient examined, no change in status, stable for surgery.  I have reviewed the patient's chart and labs.  Questions were answered to the patient's satisfaction.     Ellwood Steidle A.

## 2017-01-19 NOTE — Transfer of Care (Signed)
Immediate Anesthesia Transfer of Care Note  Patient: Katelyn Villanueva  Procedure(s) Performed: Procedure(s): RIGHT BREAST RADIOACTIVE SEED GUIDED PARTIAL MASTECTOMY WITH RIGHT  SENTINEL LYMPH NODE MAPPING (Right)  Patient Location: PACU  Anesthesia Type:General  Level of Consciousness: awake and drowsy  Airway & Oxygen Therapy: Patient Spontanous Breathing and Patient connected to face mask oxygen  Post-op Assessment: Report given to RN and Post -op Vital signs reviewed and stable  Post vital signs: Reviewed and stable  Last Vitals:  Vitals:   01/19/17 0725 01/19/17 0730  BP:  (!) 163/73  Pulse: (!) 113 (!) 128  Resp: 20 14  Temp:      Last Pain:  Vitals:   01/19/17 0626  TempSrc: Oral         Complications: No apparent anesthesia complications

## 2017-01-19 NOTE — Anesthesia Preprocedure Evaluation (Signed)
Anesthesia Evaluation  Patient identified by MRN, date of birth, ID band Patient awake    Reviewed: Allergy & Precautions, NPO status , Patient's Chart, lab work & pertinent test results  History of Anesthesia Complications (+) PONV  Airway Mallampati: I  TM Distance: >3 FB Neck ROM: Full    Dental   Pulmonary    Pulmonary exam normal        Cardiovascular Normal cardiovascular exam     Neuro/Psych    GI/Hepatic   Endo/Other    Renal/GU      Musculoskeletal   Abdominal   Peds  Hematology   Anesthesia Other Findings   Reproductive/Obstetrics                             Anesthesia Physical Anesthesia Plan  ASA: II  Anesthesia Plan: General   Post-op Pain Management:  Regional for Post-op pain   Induction: Intravenous  PONV Risk Score and Plan: 3 and Ondansetron, Dexamethasone, Propofol, Midazolam and Treatment may vary due to age or medical condition  Airway Management Planned: LMA  Additional Equipment:   Intra-op Plan:   Post-operative Plan: Extubation in OR  Informed Consent: I have reviewed the patients History and Physical, chart, labs and discussed the procedure including the risks, benefits and alternatives for the proposed anesthesia with the patient or authorized representative who has indicated his/her understanding and acceptance.     Plan Discussed with: CRNA and Surgeon  Anesthesia Plan Comments:         Anesthesia Quick Evaluation

## 2017-01-19 NOTE — Discharge Instructions (Signed)
[place] Office Phone Number [phone]  BREAST BIOPSY/ PARTIAL MASTECTOMY: POST OP INSTRUCTIONS  Always review your discharge instruction sheet given to you by the facility where your surgery was performed.  IF YOU HAVE DISABILITY OR FAMILY LEAVE FORMS, YOU MUST BRING THEM TO THE OFFICE FOR PROCESSING.  DO NOT GIVE THEM TO YOUR DOCTOR.  1. A prescription for pain medication may be given to you upon discharge.  Take your pain medication as prescribed, if needed.  If narcotic pain medicine is not needed, then you may take acetaminophen (Tylenol) or ibuprofen (Advil) as needed. 2. Take your usually prescribed medications unless otherwise directed 3. If you need a refill on your pain medication, please contact your pharmacy.  They will contact our office to request authorization.  Prescriptions will not be filled after 5pm or on week-ends. 4. You should eat very light the first 24 hours after surgery, such as soup, crackers, pudding, etc.  Resume your normal diet the day after surgery. 5. Most patients will experience some swelling and bruising in the breast.  Ice packs and a good support bra will help.  Swelling and bruising can take several days to resolve.  6. It is common to experience some constipation if taking pain medication after surgery.  Increasing fluid intake and taking a stool softener will usually help or prevent this problem from occurring.  A mild laxative (Milk of Magnesia or Miralax) should be taken according to package directions if there are no bowel movements after 48 hours. 7. Unless discharge instructions indicate otherwise, you may remove your bandages 24-48 hours after surgery, and you may shower at that time.  You may have steri-strips (small skin tapes) in place directly over the incision.  These strips should be left on the skin for 7-10 days.  If your surgeon used skin glue on the incision, you may shower in 24 hours.  The glue will flake off over the  next 2-3 weeks.  Any sutures or staples will be removed at the office during your follow-up visit. 8. ACTIVITIES:  You may resume regular daily activities (gradually increasing) beginning the next day.  Wearing a good support bra or sports bra minimizes pain and swelling.  You may have sexual intercourse when it is comfortable. a. You may drive when you no longer are taking prescription pain medication, you can comfortably wear a seatbelt, and you can safely maneuver your car and apply brakes. b. RETURN TO WORK:  _____________________2 WEEKS _________________________________________________________________ 9. You should see your doctor in the office for a follow-up appointment approximately two weeks after your surgery.  Your doctors nurse will typically make your follow-up appointment when she calls you with your pathology report.  Expect your pathology report 2-3 business days after your surgery.  You may call to check if you do not hear from [place] after three days. 10. OTHER INSTRUCTIONS: _______________________________________________________________________________________________ _____________________________________________________________________________________________________________________________________ _____________________________________________________________________________________________________________________________________ _____________________________________________________________________________________________________________________________________  WHEN TO CALL YOUR DOCTOR: 1. Fever over 101.0 2. Nausea and/or vomiting. 3. Extreme swelling or bruising. 4. Continued bleeding from incision. 5. Increased pain, redness, or drainage from the incision.  The clinic staff is available to answer your questions during regular business hours.  Please dont hesitate to call and ask to speak to one of the nurses for clinical concerns.  If you have a medical emergency, go to the  nearest emergency room or call 911.  A surgeon from [facility] is always on call at the hospital.  For further questions, please visit  [email or web address]       Post Anesthesia Home Care Instructions  Activity: Get plenty of rest for the remainder of the day. A responsible individual must stay with you for 24 hours following the procedure.  For the next 24 hours, DO NOT: -Drive a car -Paediatric nurse -Drink alcoholic beverages -Take any medication unless instructed by your physician -Make any legal decisions or sign important papers.  Meals: Start with liquid foods such as gelatin or soup. Progress to regular foods as tolerated. Avoid greasy, spicy, heavy foods. If nausea and/or vomiting occur, drink only clear liquids until the nausea and/or vomiting subsides. Call your physician if vomiting continues.  Special Instructions/Symptoms: Your throat may feel dry or sore from the anesthesia or the breathing tube placed in your throat during surgery. If this causes discomfort, gargle with warm salt water. The discomfort should disappear within 24 hours.  If you had a scopolamine patch placed behind your ear for the management of post- operative nausea and/or vomiting:  1. The medication in the patch is effective for 72 hours, after which it should be removed.  Wrap patch in a tissue and discard in the trash. Wash hands thoroughly with soap and water. 2. You may remove the patch earlier than 72 hours if you experience unpleasant side effects which may include dry mouth, dizziness or visual disturbances. 3. Avoid touching the patch. Wash your hands with soap and water after contact with the patch.

## 2017-01-19 NOTE — Anesthesia Procedure Notes (Signed)
Procedure Name: LMA Insertion Date/Time: 01/19/2017 7:45 AM Performed by: Melynda Ripple D Pre-anesthesia Checklist: Patient identified, Emergency Drugs available, Suction available and Patient being monitored Patient Re-evaluated:Patient Re-evaluated prior to inductionOxygen Delivery Method: Circle system utilized Preoxygenation: Pre-oxygenation with 100% oxygen Intubation Type: IV induction Ventilation: Mask ventilation without difficulty LMA: LMA inserted LMA Size: 4.0 Number of attempts: 1 Airway Equipment and Method: Bite block Placement Confirmation: positive ETCO2 Tube secured with: Tape Dental Injury: Teeth and Oropharynx as per pre-operative assessment

## 2017-01-19 NOTE — Progress Notes (Signed)
Assisted Dr. Ossey with right, ultrasound guided, pectoralis block. Side rails up, monitors on throughout procedure. See vital signs in flow sheet. Tolerated Procedure well. 

## 2017-01-22 ENCOUNTER — Encounter (HOSPITAL_BASED_OUTPATIENT_CLINIC_OR_DEPARTMENT_OTHER): Payer: Self-pay | Admitting: Surgery

## 2017-01-26 ENCOUNTER — Telehealth: Payer: Self-pay | Admitting: *Deleted

## 2017-01-26 ENCOUNTER — Encounter: Payer: Self-pay | Admitting: *Deleted

## 2017-01-26 ENCOUNTER — Ambulatory Visit (HOSPITAL_BASED_OUTPATIENT_CLINIC_OR_DEPARTMENT_OTHER): Payer: BLUE CROSS/BLUE SHIELD | Admitting: Hematology and Oncology

## 2017-01-26 DIAGNOSIS — C50411 Malignant neoplasm of upper-outer quadrant of right female breast: Secondary | ICD-10-CM | POA: Diagnosis not present

## 2017-01-26 DIAGNOSIS — Z170 Estrogen receptor positive status [ER+]: Secondary | ICD-10-CM | POA: Diagnosis not present

## 2017-01-26 NOTE — Assessment & Plan Note (Signed)
12/25/2016:Right breast biopsy 10:00 position: IDC with DCIS grade 1, ER 90%, PR 90%, Ki-67 3%, HER-2 negative ratio 1.38, screening mammogram revealed a 1.5 cm right UOQ mass, T1c N0 stage IA clinical stage  Pathology and radiology counseling:Discussed with the patient, the details of pathology including the type of breast cancer,the clinical staging, the significance of ER, PR and HER-2/neu receptors and the implications for treatment. After reviewing the pathology in detail, we proceeded to discuss the different treatment options between surgery, radiation, chemotherapy, antiestrogen therapies.  Recommendations: 1. Breast conserving surgery followed by 2. Oncotype DX testing to determine if chemotherapy would be of any benefit followed by 3. Adjuvant radiation therapy followed by 4. Adjuvant antiestrogen therapy  Oncotype counseling: I discussed Oncotype DX test. I explained to the patient that this is a 21 gene panel to evaluate patient tumors DNA to calculate recurrence score. This would help determine whether patient has high risk or intermediate risk or low risk breast cancer. She understands that if her tumor was found to be high risk, she would benefit from systemic chemotherapy. If low risk, no need of chemotherapy. If she was found to be intermediate risk, we would need to evaluate the score as well as other risk factors and determine if an abbreviated chemotherapy may be of benefit.  Return to clinic after surgery to discuss final pathology report and then determine if Oncotype DX testing will need to be sent.

## 2017-01-26 NOTE — Telephone Encounter (Signed)
Received order for oncotype testing. Requisition sent to pathology and genomic health.

## 2017-01-26 NOTE — Progress Notes (Signed)
[facility] [facility] NOTE  Patient Care Team: [place], [place] [facility] as PCP - General (Family Medicine)  CHIEF COMPLAINTS/PURPOSE OF CONSULTATION:  Newly diagnosed breast cancer  HISTORY OF PRESENTING ILLNESS:  Katelyn Villanueva 51 y.o. female is here because of recent diagnosis of right-sided breast cancer. She had a routine screening mammogram that detected a right breast abnormality measuring 1.5 cm. 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 She underwent right lumpectomy on 7 2018 which revealed a 2.1 cm invasive ductal carcinoma with DCIS grade 1 that was ER/PR positive and 2 lymph nodes were negative. She is here today to discuss overall treatment plan. She has some mild discomfort in the axilla and the spot of redness as well. There is slight increase in pain sensation. She is here today accompanied by her husband and her son. They live in [place] and it takes 1 hour 20 minutes to drive to here.  I reviewed her records extensively and collaborated the history with the patient.  SUMMARY OF ONCOLOGIC HISTORY:   Malignant neoplasm of upper-outer quadrant of right breast in female, estrogen receptor positive ([place])   12/25/2016 Initial Diagnosis    Right breast biopsy 10:00 position: IDC with DCIS grade 1, ER 90%, PR 90%, Ki-67 3%, HER-2 negative ratio 1.38, screening mammogram revealed a 1.5 cm right UOQ mass, T1c N0 stage IA clinical stage      01/19/2017 Surgery    Right lumpectomy: IDC grade 1, 2.1 cm, DCIS grade 1, margins negative, 0/2 lymph nodes negative, ER 90%, PR 90%, HER-2 negative, Ki-67 3%, T2 N0 M0 stage II a      MEDICAL HISTORY:  Past Medical History:  Diagnosis Date  . Cancer ([place]) 12/2016   right breast cancer  . Complication of anesthesia   . PONV (postoperative nausea and vomiting)     SURGICAL HISTORY: Past Surgical History:  Procedure Laterality Date  . APPENDECTOMY    .  CHOLECYSTECTOMY    . RADIOACTIVE SEED GUIDED MASTECTOMY WITH AXILLARY SENTINEL LYMPH NODE BIOPSY Right 01/19/2017   Procedure: RIGHT BREAST RADIOACTIVE SEED GUIDED PARTIAL MASTECTOMY WITH RIGHT  SENTINEL LYMPH NODE MAPPING;  Surgeon: Erroll Luna, MD;  Location: [facility];  Service: General;  Laterality: Right;  . TUBAL LIGATION      SOCIAL HISTORY: Social History   Social History  . Marital status: Married    Spouse name: N/A  . Number of children: N/A  . Years of education: N/A   Occupational History  . Not on file.   Social History Main Topics  . Smoking status: Never Smoker  . Smokeless tobacco: Never Used  . Alcohol use No  . Drug use: No  . Sexual activity: Not on file   Other Topics Concern  . Not on file   Social History Narrative  . No narrative on file    FAMILY HISTORY: No family history on file.  ALLERGIES:  has No Known Allergies.  MEDICATIONS:  Current Outpatient Prescriptions  Medication Sig Dispense Refill  . acetaminophen (TYLENOL) 325 MG tablet Take 650 mg by mouth every 6 (six) hours as needed.    Marland Kitchen ibuprofen (ADVIL,MOTRIN) 800 MG tablet Take 1 tablet (800 mg total) by mouth every 8 (eight) hours as needed. 30 tablet 0  . oxyCODONE (OXY IR/ROXICODONE) 5 MG immediate release tablet Take 1-2 tablets (5-10 mg total) by mouth every 6 (six) hours as needed for severe pain. 12 tablet 0  No current facility-administered medications for this visit.     REVIEW OF SYSTEMS:   Constitutional: Denies fevers, chills or abnormal night sweats Eyes: Denies blurriness of vision, double vision or watery eyes Ears, nose, mouth, throat, and face: Denies mucositis or sore throat Respiratory: Denies cough, dyspnea or wheezes Cardiovascular: Denies palpitation, chest discomfort or lower extremity swelling Gastrointestinal:  Denies nausea, heartburn or change in bowel habits Skin: Denies abnormal skin rashes Lymphatics: Denies new lymphadenopathy or  easy bruising Neurological:Denies numbness, tingling or new weaknesses Behavioral/Psych: Mood is stable, no new changes  Breast:  Recent right lumpectomy All other systems were reviewed with the patient and are negative.  PHYSICAL EXAMINATION: ECOG PERFORMANCE STATUS: 1 - Symptomatic but completely ambulatory  Vitals:   01/26/17 1536  BP: (!) 135/59  Pulse: 95  Resp: 19  Temp: 98.2 F (36.8 C)   Filed Weights   01/26/17 1536  Weight: 205 lb 4.8 oz (93.1 kg)    GENERAL:alert, no distress and comfortable SKIN: skin color, texture, turgor are normal, no rashes or significant lesions EYES: normal, conjunctiva are pink and non-injected, sclera clear OROPHARYNX:no exudate, no erythema and lips, buccal mucosa, and tongue normal  NECK: supple, thyroid normal size, non-tender, without nodularity LYMPH:  no palpable lymphadenopathy in the cervical, axillary or inguinal LUNGS: clear to auscultation and percussion with normal breathing effort HEART: regular rate & rhythm and no murmurs and no lower extremity edema ABDOMEN:abdomen soft, non-tender and normal bowel sounds Musculoskeletal:no cyanosis of digits and no clubbing  PSYCH: alert & oriented x 3 with fluent speech NEURO: no focal motor/sensory deficits  LABORATORY DATA:  I have reviewed the data as listed Lab Results  Component Value Date   WBC 6.9 01/13/2017   HGB 13.8 01/13/2017   HCT 41.5 01/13/2017   MCV 87.4 01/13/2017   PLT 239 01/13/2017   Lab Results  Component Value Date   NA 136 01/13/2017   K 4.8 01/13/2017   CL 104 01/13/2017   CO2 28 01/13/2017    RADIOGRAPHIC STUDIES: I have personally reviewed the radiological reports and agreed with the findings in the report.  ASSESSMENT AND PLAN:  Malignant neoplasm of upper-outer quadrant of right breast in female, estrogen receptor positive ([place]) 12/25/2016:Right breast biopsy 10:00 position: IDC with DCIS grade 1, ER 90%, PR 90%, Ki-67 3%, HER-2 negative ratio  1.38, screening mammogram revealed a 1.5 cm right UOQ mass, T1c N0 stage IA clinical stage 01/19/2017:Right lumpectomy: IDC grade 1, 2.1 cm, DCIS grade 1, margins negative, 0/2 lymph nodes negative, ER 90%, PR 90%, HER-2 negative, Ki-67 3%, T2 N0 M0 stage II a  Pathology and radiology counseling:Discussed with the patient, the details of pathology including the type of breast cancer,the clinical staging, the significance of ER, PR and HER-2/neu receptors and the implications for treatment. After reviewing the pathology in detail, we proceeded to discuss the different treatment options between surgery, radiation, chemotherapy, antiestrogen therapies.  Recommendations: 1. Oncotype DX testing to determine if chemotherapy would be of any benefit followed by 2. Adjuvant radiation therapy followed by 3. Adjuvant antiestrogen therapy  Oncotype counseling: I discussed Oncotype DX test. I explained to the patient that this is a 21 gene panel to evaluate patient tumors DNA to calculate recurrence score. This would help determine whether patient has high risk or intermediate risk or low risk breast cancer. She understands that if her tumor was found to be high risk, she would benefit from systemic chemotherapy. If low risk, no need of  chemotherapy. If she was found to be intermediate risk, we would need to evaluate the score as well as other risk factors and determine if an abbreviated chemotherapy may be of benefit.  Return to clinic depending on the result of Oncotype DX testing. If she is low risk I will see her back after Oncotype DX test results are available.  All questions were answered. The patient knows to call the clinic with any problems, questions or concerns.    Rulon Eisenmenger, MD 01/26/17

## 2017-02-04 ENCOUNTER — Telehealth: Payer: Self-pay | Admitting: Hematology and Oncology

## 2017-02-04 ENCOUNTER — Telehealth: Payer: Self-pay | Admitting: *Deleted

## 2017-02-04 NOTE — Telephone Encounter (Signed)
Received oncotype score of 20/13%. Physician team notified. Left vm for pt to return call to discuss results. Contact information provided. Referral will be sent for xrt in [place].

## 2017-02-04 NOTE — Telephone Encounter (Signed)
Pt appt with [facility] is 02/26/17 @9 :00 Pt is aware. Cd will be fedex'ed to [facility]

## 2017-02-05 ENCOUNTER — Ambulatory Visit: Payer: BLUE CROSS/BLUE SHIELD

## 2017-02-05 ENCOUNTER — Ambulatory Visit: Payer: BLUE CROSS/BLUE SHIELD | Admitting: Radiation Oncology

## 2017-02-06 ENCOUNTER — Encounter (HOSPITAL_COMMUNITY): Payer: Self-pay

## 2017-03-13 ENCOUNTER — Telehealth: Payer: Self-pay

## 2017-03-13 NOTE — Telephone Encounter (Signed)
Called and left a message with f/u appt per 8/24 inbasket message  Katelyn Villanueva

## 2017-05-15 ENCOUNTER — Telehealth: Payer: Self-pay | Admitting: Hematology and Oncology

## 2017-05-15 NOTE — Telephone Encounter (Signed)
Spoke with patient and rescheduled appt per their availability.

## 2017-05-19 ENCOUNTER — Ambulatory Visit: Payer: BLUE CROSS/BLUE SHIELD | Admitting: Hematology and Oncology

## 2017-05-28 ENCOUNTER — Telehealth: Payer: Self-pay | Admitting: Adult Health

## 2017-05-28 ENCOUNTER — Ambulatory Visit (HOSPITAL_BASED_OUTPATIENT_CLINIC_OR_DEPARTMENT_OTHER): Payer: BLUE CROSS/BLUE SHIELD | Admitting: Hematology and Oncology

## 2017-05-28 DIAGNOSIS — F39 Unspecified mood [affective] disorder: Secondary | ICD-10-CM | POA: Diagnosis not present

## 2017-05-28 DIAGNOSIS — Z79810 Long term (current) use of selective estrogen receptor modulators (SERMs): Secondary | ICD-10-CM | POA: Diagnosis not present

## 2017-05-28 DIAGNOSIS — C50411 Malignant neoplasm of upper-outer quadrant of right female breast: Secondary | ICD-10-CM

## 2017-05-28 DIAGNOSIS — Z170 Estrogen receptor positive status [ER+]: Secondary | ICD-10-CM | POA: Diagnosis not present

## 2017-05-28 NOTE — Telephone Encounter (Signed)
Gave avs and calendar for February 2019 °

## 2017-05-28 NOTE — Progress Notes (Signed)
Patient Care Team: [place], [place] [facility] as PCP - General (Family Medicine)  DIAGNOSIS:  Encounter Diagnosis  Name Primary?  . Malignant neoplasm of upper-outer quadrant of right breast in female, estrogen receptor positive ([place])     SUMMARY OF ONCOLOGIC HISTORY:   Malignant neoplasm of upper-outer quadrant of right breast in female, estrogen receptor positive ([place])   12/25/2016 Initial Diagnosis    Right breast biopsy 10:00 position: IDC with DCIS grade 1, ER 90%, PR 90%, Ki-67 3%, HER-2 negative ratio 1.38, screening mammogram revealed a 1.5 cm right UOQ mass, T1c N0 stage IA clinical stage      01/19/2017 Surgery    Right lumpectomy: IDC grade 1, 2.1 cm, DCIS grade 1, margins negative, 0/2 lymph nodes negative, ER 90%, PR 90%, HER-2 negative, Ki-67 3%, T2 N0 M0 stage II a      02/04/2017 Oncotype testing    Oncotype DX score 20: 10-year risk of distant recurrence 13% with tamoxifen alone      03/17/2017 - 04/14/2017 Radiation Therapy    Adj XRT       Anti-estrogen oral therapy    Patient was offered tamoxifen therapy because she is still premenopausal.  Patient however refused it       CHIEF COMPLIANT: Follow-up after radiation therapy  INTERVAL HISTORY: Julieth Tugman is a 51 year old with above-mentioned history of right lumpectomy and recently completed adjuvant radiation therapy.  She had Oncotype DX score of 20 is here today to discuss the role of antiestrogen therapy.  She is recovering very well from recent radiation.  She tells me that she is really troubled by mood swings especially before her cycles.  She is extremely anxious and worried that tamoxifen could make her symptoms worse.  REVIEW OF SYSTEMS:   Constitutional: Denies fevers, chills or abnormal weight loss Eyes: Denies blurriness of vision Ears, nose, mouth, throat, and face: Denies mucositis or sore throat Respiratory: Denies cough, dyspnea or wheezes Cardiovascular: Denies palpitation, chest  discomfort Gastrointestinal:  Denies nausea, heartburn or change in bowel habits Skin: Denies abnormal skin rashes Lymphatics: Denies new lymphadenopathy or easy bruising Neurological:Denies numbness, tingling or new weaknesses Behavioral/Psych: Mood is stable, no new changes  Extremities: No lower extremity edema Breast:  denies any pain or lumps or nodules in either breasts All other systems were reviewed with the patient and are negative.  I have reviewed the past medical history, past surgical history, social history and family history with the patient and they are unchanged from previous note.  ALLERGIES:  has No Known Allergies.  MEDICATIONS:  Current Outpatient Medications  Medication Sig Dispense Refill  . acetaminophen (TYLENOL) 325 MG tablet Take 650 mg by mouth every 6 (six) hours as needed.    Marland Kitchen ibuprofen (ADVIL,MOTRIN) 800 MG tablet Take 1 tablet (800 mg total) by mouth every 8 (eight) hours as needed. 30 tablet 0  . oxyCODONE (OXY IR/ROXICODONE) 5 MG immediate release tablet Take 1-2 tablets (5-10 mg total) by mouth every 6 (six) hours as needed for severe pain. 12 tablet 0   No current facility-administered medications for this visit.     PHYSICAL EXAMINATION: ECOG PERFORMANCE STATUS: 1 - Symptomatic but completely ambulatory  Vitals:   05/28/17 1513  BP: (!) 155/87  Pulse: 95  Resp: 20  Temp: 98.3 F (36.8 C)  SpO2: 99%   Filed Weights   05/28/17 1513  Weight: 211 lb 14.4 oz (96.1 kg)    GENERAL:alert, no distress and comfortable SKIN: skin color,  texture, turgor are normal, no rashes or significant lesions EYES: normal, Conjunctiva are pink and non-injected, sclera clear OROPHARYNX:no exudate, no erythema and lips, buccal mucosa, and tongue normal  NECK: supple, thyroid normal size, non-tender, without nodularity LYMPH:  no palpable lymphadenopathy in the cervical, axillary or inguinal LUNGS: clear to auscultation and percussion with normal breathing  effort HEART: regular rate & rhythm and no murmurs and no lower extremity edema ABDOMEN:abdomen soft, non-tender and normal bowel sounds MUSCULOSKELETAL:no cyanosis of digits and no clubbing  NEURO: alert & oriented x 3 with fluent speech, no focal motor/sensory deficits EXTREMITIES: No lower extremity edema  LABORATORY DATA:  I have reviewed the data as listed   Chemistry      Component Value Date/Time   NA 136 01/13/2017 1201   K 4.8 01/13/2017 1201   CL 104 01/13/2017 1201   CO2 28 01/13/2017 1201   BUN 8 01/13/2017 1201   CREATININE 0.63 01/13/2017 1201      Component Value Date/Time   CALCIUM 10.7 (H) 01/13/2017 1201   ALKPHOS 65 01/13/2017 1201   AST 18 01/13/2017 1201   ALT 23 01/13/2017 1201   BILITOT 0.9 01/13/2017 1201       Lab Results  Component Value Date   WBC 6.9 01/13/2017   HGB 13.8 01/13/2017   HCT 41.5 01/13/2017   MCV 87.4 01/13/2017   PLT 239 01/13/2017   NEUTROABS 2.4 01/13/2017    ASSESSMENT & PLAN:  Malignant neoplasm of upper-outer quadrant of right breast in female, estrogen receptor positive ([place]) 12/25/2016:Right breast biopsy 10:00 position: IDC with DCIS grade 1, ER 90%, PR 90%, Ki-67 3%, HER-2 negative ratio 1.38, screening mammogram revealed a 1.5 cm right UOQ mass, T1c N0 stage IA clinical stage 01/19/2017:Right lumpectomy: IDC grade 1, 2.1 cm, DCIS grade 1, margins negative, 0/2 lymph nodes negative, ER 90%, PR 90%, HER-2 negative, Ki-67 3%, T2 N0 M0 stage II a  Oncotype DX score 20: Risk of recurrence 13%  Adjuvant radiation therapy completed in September 2018  Treatment plan: Antiestrogen therapy with tamoxifen 20 mg daily times 5 years was recommended but the patient does not want to take any antiestrogen therapy.  She understands the risks and benefits.  If she changes her mind she will call us and inform us.  Return to clinic in 3 months for survivorship care plan visit after that she can be followed by survivorship once a  year.   I spent 25 minutes talking to the patient of which more than half was spent in counseling and coordination of care.  No orders of the defined types were placed in this encounter.  The patient has a good understanding of the overall plan. she agrees with it. she will call with any problems that may develop before the next visit here.   Rulon Eisenmenger, MD 05/28/17

## 2017-05-28 NOTE — Assessment & Plan Note (Signed)
12/25/2016:Right breast biopsy 10:00 position: IDC with DCIS grade 1, ER 90%, PR 90%, Ki-67 3%, HER-2 negative ratio 1.38, screening mammogram revealed a 1.5 cm right UOQ mass, T1c N0 stage IA clinical stage 01/19/2017:Right lumpectomy: IDC grade 1, 2.1 cm, DCIS grade 1, margins negative, 0/2 lymph nodes negative, ER 90%, PR 90%, HER-2 negative, Ki-67 3%, T2 N0 M0 stage II a  Oncotype DX score 20: Risk of recurrence 13%  Adjuvant radiation therapy  Treatment plan: Antiestrogen therapy with anastrozole 1 mg daily times 5 years  Return to clinic in 3 months for survivorship care plan visit

## 2017-05-29 ENCOUNTER — Telehealth: Payer: Self-pay | Admitting: Adult Health

## 2017-05-29 NOTE — Telephone Encounter (Signed)
Called patient to reschedule in an SCP  block

## 2017-08-25 ENCOUNTER — Telehealth: Payer: Self-pay | Admitting: *Deleted

## 2017-08-25 ENCOUNTER — Ambulatory Visit: Payer: BLUE CROSS/BLUE SHIELD | Admitting: Adult Health

## 2017-08-25 ENCOUNTER — Telehealth: Payer: Self-pay

## 2017-08-25 NOTE — Telephone Encounter (Signed)
Called and spoke with patient to clarify her concerns. Thank you.

## 2017-08-25 NOTE — Telephone Encounter (Signed)
Despite call transfer to collaborative, patient called back to ensure aopointment for today has been cancelled.  This nurse canceled appointment after notifying A.P.P.  "What is the purpose of today's survivorship apointment?  I'd like to cancel if it's not needed.  I refused Tamoxifen and moved on.  I'll call to schedule mammograms."  Described importance of discussing her personal disposition along with living with diagnosis as the future unfolds.  Some mammograms require a doctor's order.  What will she need from her PCP.  "I am still paying for genetic test and more.  I do not want to make any new bills."

## 2017-08-25 NOTE — Telephone Encounter (Signed)
Incoming call from pt to cancel today's appt with [facility] NP for surviorship visit.  Pt states due to financial reasons and that her son was in a recent accident, she prefers not to keep today's appt. She was interested in knowing what would be covered in the appt and per Dr Lindi Adie, he would like the care plan mailed to her.  She agreed and encouraged pt to call back for a survivorship appt at a later time if she would like.  She asked about follow up mammograms and made pt aware to call [facility] before May 2019 to schedule her's. Pt voiced understanding. Will notify [facility] NP that pt canceled and to arrange for care plan to mailed to pt.

## 2017-08-26 ENCOUNTER — Telehealth: Payer: Self-pay | Admitting: Adult Health

## 2017-08-26 NOTE — Telephone Encounter (Signed)
The Survivorship Care Plan was mailed to Ms. Webb as she reported not being able to come in to the Survivorship Clinic for an in-person visit at this time. A letter was mailed to her outlining the purpose of the content of the care plan, as well as encouraging her to reach out to me with any questions or concerns.  Additional resources regarding healthy eating, recommendations for exercise, L[employer] program, and brochures for support services were also included in the mailed packet.  A shorter version of the care plan was also routed/faxed/mailed to [facility], [facility], the patient's PCP.  I will not be placing any follow-up appointments to the Survivorship Clinic for Ms. Baisley, but I am happy to see her at any time in the future for any survivorship concerns that may arise. Thank you for allowing me to participate in her care!  Gardenia Phlegm, [place] [phone]

## 2017-08-28 ENCOUNTER — Ambulatory Visit: Payer: BLUE CROSS/BLUE SHIELD | Admitting: Adult Health

## 2017-11-12 ENCOUNTER — Other Ambulatory Visit: Payer: Self-pay | Admitting: Hematology and Oncology

## 2017-11-12 DIAGNOSIS — Z853 Personal history of malignant neoplasm of breast: Secondary | ICD-10-CM

## 2017-12-23 ENCOUNTER — Other Ambulatory Visit: Payer: BLUE CROSS/BLUE SHIELD

## 2017-12-24 ENCOUNTER — Ambulatory Visit
Admission: RE | Admit: 2017-12-24 | Discharge: 2017-12-24 | Disposition: A | Discharge status: Home / Self Care | Payer: BLUE CROSS/BLUE SHIELD | Source: Ambulatory Visit | Attending: Hematology and Oncology | Admitting: Hematology and Oncology

## 2017-12-24 DIAGNOSIS — Z853 Personal history of malignant neoplasm of breast: Secondary | ICD-10-CM

## 2017-12-24 HISTORY — DX: Personal history of irradiation: Z92.3

## 2018-11-16 ENCOUNTER — Other Ambulatory Visit: Payer: Self-pay | Admitting: Hematology and Oncology

## 2018-11-16 DIAGNOSIS — Z853 Personal history of malignant neoplasm of breast: Secondary | ICD-10-CM

## 2018-12-28 ENCOUNTER — Ambulatory Visit
Admission: RE | Admit: 2018-12-28 | Discharge: 2018-12-28 | Disposition: A | Discharge status: Home / Self Care | Payer: BC Managed Care – PPO | Source: Ambulatory Visit | Attending: Hematology and Oncology | Admitting: Hematology and Oncology

## 2018-12-28 ENCOUNTER — Other Ambulatory Visit: Payer: Self-pay

## 2018-12-28 DIAGNOSIS — Z853 Personal history of malignant neoplasm of breast: Secondary | ICD-10-CM

## 2019-12-12 ENCOUNTER — Other Ambulatory Visit: Payer: Self-pay | Admitting: Hematology and Oncology

## 2019-12-12 ENCOUNTER — Other Ambulatory Visit: Payer: Self-pay | Admitting: Specialist

## 2019-12-12 DIAGNOSIS — Z853 Personal history of malignant neoplasm of breast: Secondary | ICD-10-CM

## 2020-04-05 ENCOUNTER — Ambulatory Visit
Admission: RE | Admit: 2020-04-05 | Discharge: 2020-04-05 | Disposition: A | Discharge status: Home / Self Care | Payer: BC Managed Care – PPO | Source: Ambulatory Visit | Attending: Specialist | Admitting: Specialist

## 2020-04-05 ENCOUNTER — Other Ambulatory Visit: Payer: Self-pay

## 2020-04-05 DIAGNOSIS — Z853 Personal history of malignant neoplasm of breast: Secondary | ICD-10-CM

## 2022-01-14 IMAGING — MG DIGITAL DIAGNOSTIC BILAT W/ TOMO W/ CAD
6 of 11 series · 6 of 31 positions shown · non-contrast
Comparison: Previous exam(s).

CLINICAL DATA: 54-year-old female for annual follow-up. History of
RIGHT breast cancer and lumpectomy in 7467.

EXAM:
DIGITAL DIAGNOSTIC BILATERAL MAMMOGRAM WITH CAD AND TOMO

[R MLO]
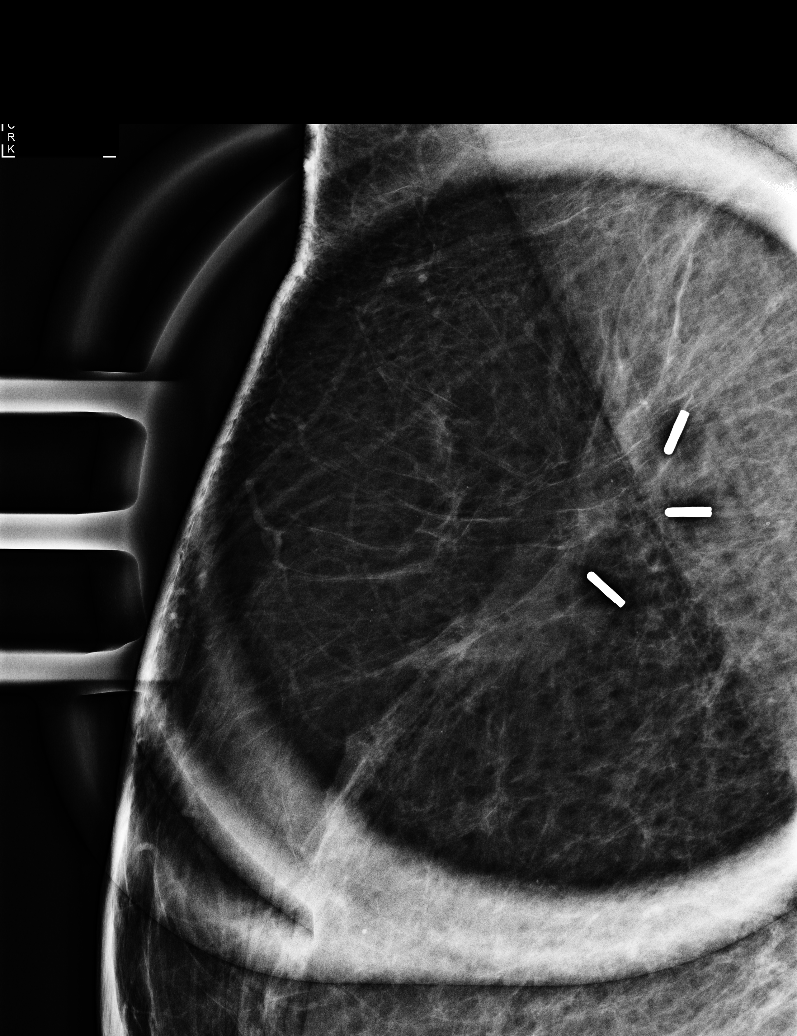

[L CC synth-2D]
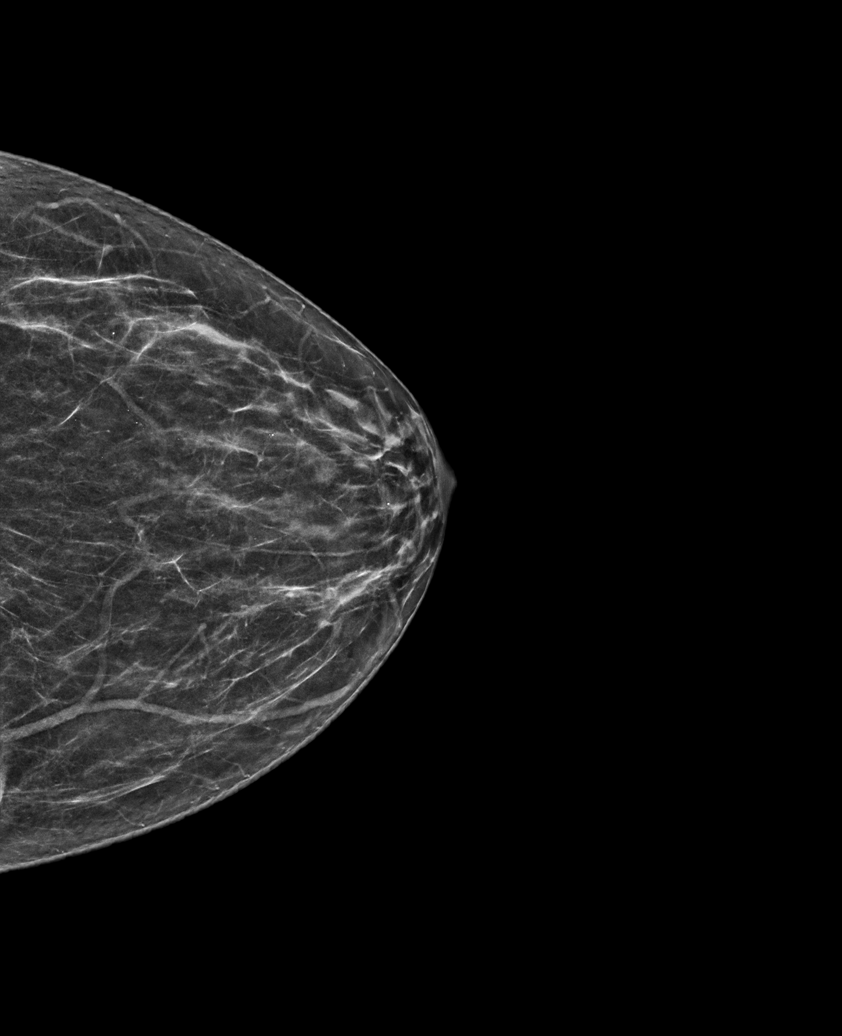

[R XCCL synth-2D]
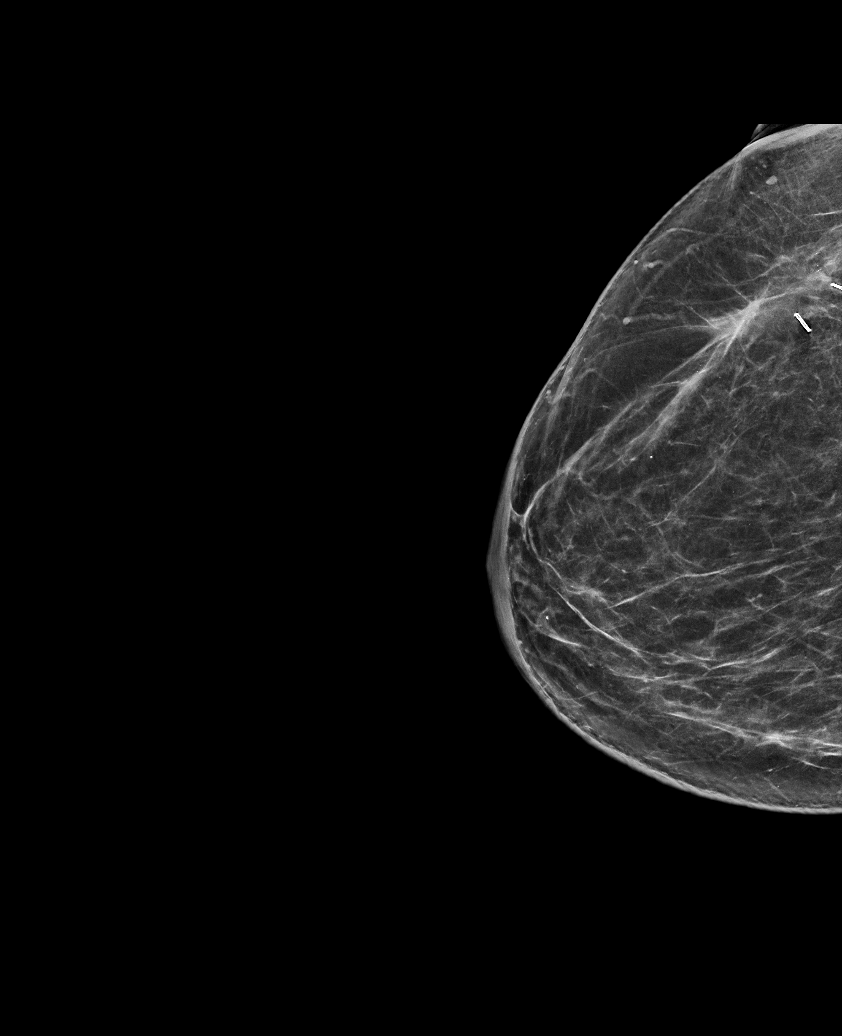

[R MLO synth-2D]
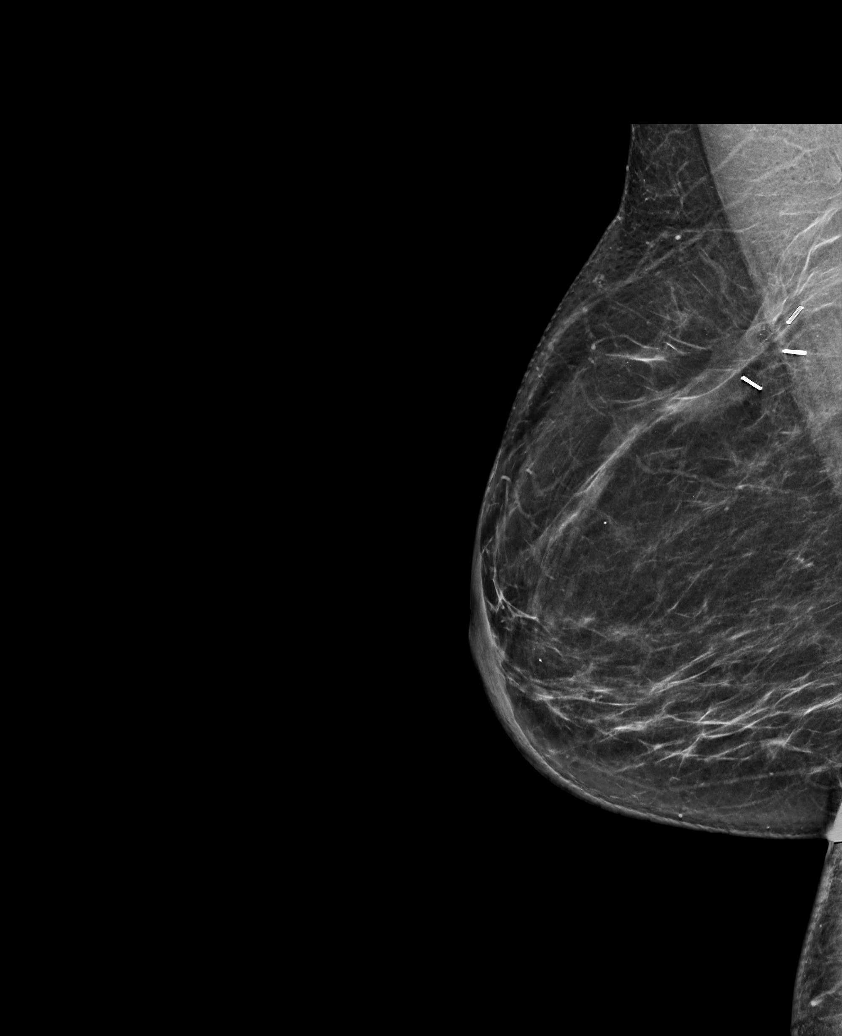

[R CC synth-2D]
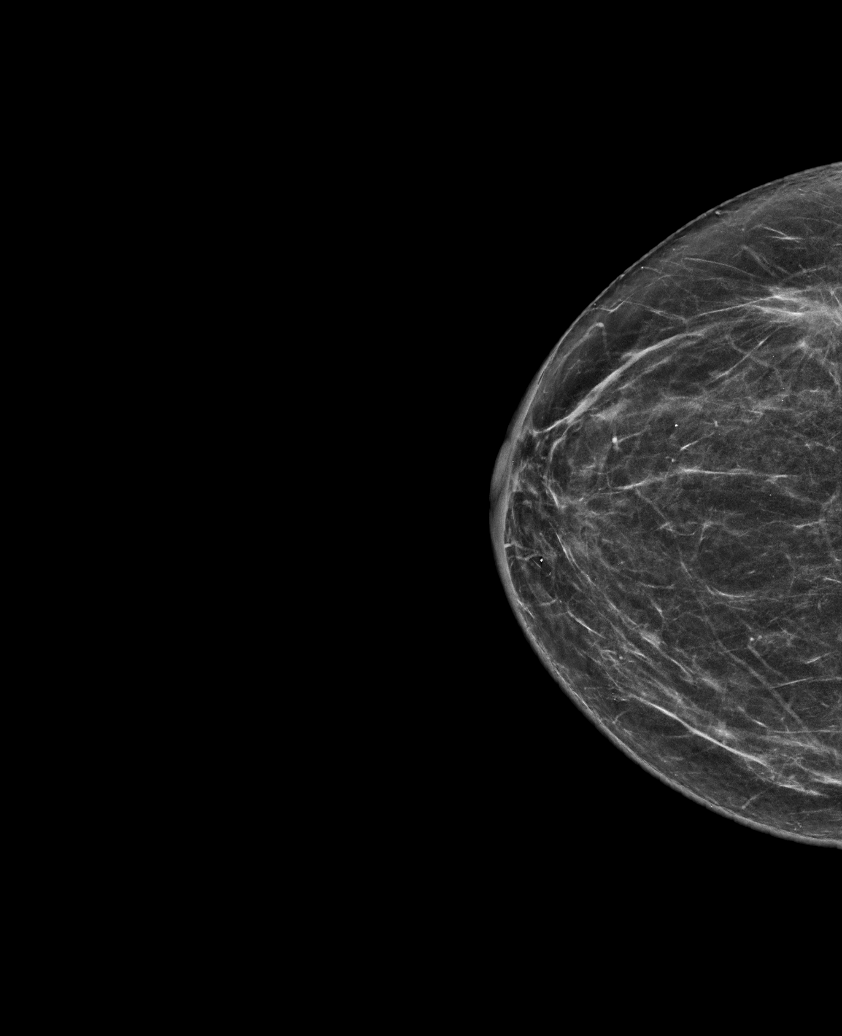

[L MLO synth-2D]
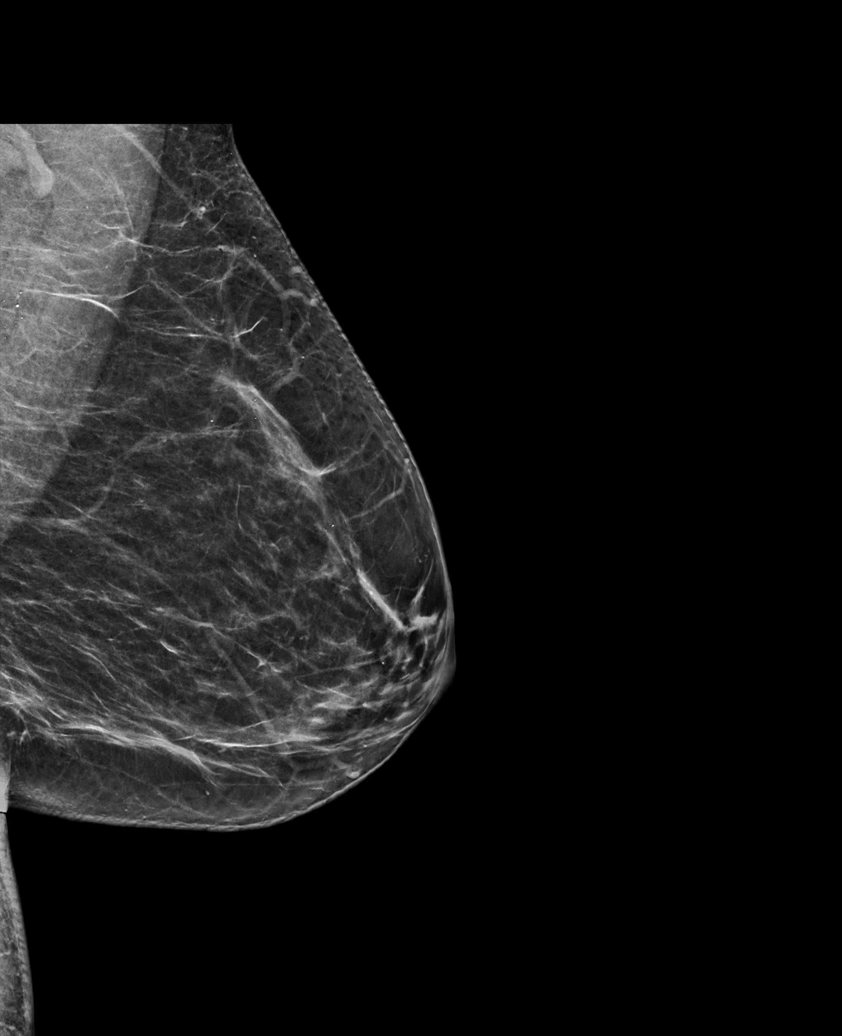

[6 of 31 positions shown; findings below may reference images not displayed]

ACR Breast Density Category b: There are scattered areas of
fibroglandular density.
FINDINGS: 2D and 3D full field views of both breasts and a magnification view
of the lumpectomy site demonstrate no suspicious mass, nonsurgical
distortion or worrisome calcifications.

RIGHT lumpectomy changes are again noted.

Mammographic images were processed with CAD.
IMPRESSION: No mammographic evidence of breast malignancy.

RECOMMENDATION:
Bilateral diagnostic mammogram in 1 year.

I have discussed the findings and recommendations with the patient.
If applicable, a reminder letter will be sent to the patient
regarding the next appointment.

BI-RADS CATEGORY  2: Benign.

## 2023-06-02 ENCOUNTER — Other Ambulatory Visit: Payer: Self-pay | Admitting: Student

## 2023-06-02 DIAGNOSIS — R928 Other abnormal and inconclusive findings on diagnostic imaging of breast: Secondary | ICD-10-CM

## 2023-06-15 ENCOUNTER — Ambulatory Visit
Admission: RE | Admit: 2023-06-15 | Discharge: 2023-06-15 | Disposition: A | Discharge status: Home / Self Care | Payer: BC Managed Care – PPO | Source: Ambulatory Visit | Attending: Student | Admitting: Student

## 2023-06-15 ENCOUNTER — Other Ambulatory Visit: Payer: Self-pay | Admitting: Student

## 2023-06-15 ENCOUNTER — Ambulatory Visit
Admission: RE | Admit: 2023-06-15 | Discharge: 2023-06-15 | Disposition: A | Discharge status: Home / Self Care | Payer: BC Managed Care – PPO | Source: Ambulatory Visit | Attending: Student

## 2023-06-15 DIAGNOSIS — R928 Other abnormal and inconclusive findings on diagnostic imaging of breast: Secondary | ICD-10-CM

## 2023-06-15 DIAGNOSIS — N6310 Unspecified lump in the right breast, unspecified quadrant: Secondary | ICD-10-CM

## 2023-06-15 HISTORY — PX: BREAST BIOPSY: SHX20

## 2023-06-16 ENCOUNTER — Encounter: Payer: Self-pay | Admitting: *Deleted

## 2023-06-16 LAB — SURGICAL PATHOLOGY

## 2023-06-19 ENCOUNTER — Telehealth: Payer: Self-pay | Admitting: Hematology and Oncology

## 2023-06-19 NOTE — Telephone Encounter (Signed)
Called to schedule appointment per staff message. Left VM for patient to call back to schedule an appointment.

## 2023-06-22 ENCOUNTER — Telehealth: Payer: Self-pay | Admitting: Hematology and Oncology

## 2023-06-22 NOTE — Telephone Encounter (Signed)
Scheduled appointments per referral. Patient is aware of the appointment time and date as well as the address. Patient was informed to arrive 10-15 minutes prior with updated insurance information. All questions were answered.

## 2023-06-24 NOTE — Assessment & Plan Note (Signed)
12/25/2016:Right breast biopsy 10:00 position: IDC with DCIS grade 1, ER 90%, PR 90%, Ki-67 3%, HER-2 negative ratio 1.38, screening mammogram revealed a 1.5 cm right UOQ mass, T1c N0 stage IA clinical stage 01/19/2017:Right lumpectomy: IDC grade 1, 2.1 cm, DCIS grade 1, margins negative, 0/2 lymph nodes negative, ER 90%, PR 90%, HER-2 negative, Ki-67 3%, T2 N0 M0 stage II a,   Oncotype DX score 20: Risk of recurrence 13%  Adjuvant radiation therapy completed in September 2018 Refused antiestrogen therapy --------------------------------------------------------------------------------------------------------------------------- 06/15/2023: Recurrence of right breast cancer: Mammogram detected 0.8 cm mass, axilla negative, biopsy: Grade 2 IDC ER 95%, PR 95%, HER2 2+ by IHC, FISH negative ratio 1.17, Ki-67 20%  Pathology and radiology counseling: Discussed with the patient, the details of pathology including the type of breast cancer,the clinical staging, the significance of ER, PR and HER-2/neu receptors and the implications for treatment. After reviewing the pathology in detail, we proceeded to discuss the different treatment options between surgery, radiation, chemotherapy, antiestrogen therapies.  Treatment plan: Mastectomy Adjuvant antiestrogen therapy  Return to clinic after surgery.

## 2023-06-26 ENCOUNTER — Inpatient Hospital Stay: Payer: BC Managed Care – PPO

## 2023-06-26 ENCOUNTER — Inpatient Hospital Stay: Payer: BC Managed Care – PPO | Attending: Hematology and Oncology | Admitting: Hematology and Oncology

## 2023-06-26 ENCOUNTER — Ambulatory Visit: Payer: Self-pay | Admitting: Surgery

## 2023-06-26 VITALS — BP 152/66 | HR 80 | Temp 97.9°F | Resp 18 | Wt 217.0 lb

## 2023-06-26 DIAGNOSIS — Z170 Estrogen receptor positive status [ER+]: Secondary | ICD-10-CM | POA: Diagnosis not present

## 2023-06-26 DIAGNOSIS — C50411 Malignant neoplasm of upper-outer quadrant of right female breast: Secondary | ICD-10-CM | POA: Diagnosis present

## 2023-06-26 NOTE — Progress Notes (Signed)
[facility] CONSULT NOTE  Patient Care Team: Pcp, No as PCP - General Causey, Larna Daughters, NP as Nurse Practitioner (Hematology and Oncology) Serena Croissant, MD as Consulting Physician (Hematology and Oncology) Harriette Bouillon, MD as Consulting Physician (General Surgery) Pershing Proud, RN as Oncology Nurse Navigator Donnelly Angelica, RN as Oncology Nurse Navigator  CHIEF COMPLAINTS/PURPOSE OF CONSULTATION:  Newly diagnosed breast cancer  HISTORY OF PRESENTING ILLNESS:  The patient, with a history of stage 1A breast cancer, presents with a recurrence of the disease. The new cancer is similar to the previous one, being invasive ductal cancer, strongly positive for estrogen and progesterone receptors, and negative for the HER2 receptor. The KI-67 proliferation marker is at 20%, indicating a relatively slow growth rate. The patient's cancer is graded as 2, indicating a moderate level of cell abnormality. The patient did not opt for hormone therapy after the first diagnosis but is now considering it after the recurrence. The patient is currently working as a Sports coach at [employer] and has a family history of cancer, although the type and origin of the cancer in the family are unclear.    I reviewed her records extensively and collaborated the history with the patient.  SUMMARY OF ONCOLOGIC HISTORY: Oncology History  Malignant neoplasm of upper-outer quadrant of right breast in female, estrogen receptor positive (HCC)  12/25/2016 Initial Diagnosis   Right breast biopsy 10:00 position: IDC with DCIS grade 1, ER 90%, PR 90%, Ki-67 3%, HER-2 negative ratio 1.38, screening mammogram revealed a 1.5 cm right UOQ mass, T1c N0 stage IA clinical stage   01/19/2017 Surgery   Right lumpectomy: IDC grade 1, 2.1 cm, DCIS grade 1, margins negative, 0/2 lymph nodes negative, ER 90%, PR 90%, HER-2 negative, Ki-67 3%, T2 N0 M0 stage II a   02/04/2017 Oncotype testing   Oncotype  DX score 20: 10-year risk of distant recurrence 13% with tamoxifen alone   03/17/2017 - 04/14/2017 Radiation Therapy   Adj XRT    Anti-estrogen oral therapy   Patient was offered tamoxifen therapy because she is still premenopausal.  Patient however refused it   06/15/2023 Relapse/Recurrence   Right breast biopsy: Grade 2 IDC ER 95%, PR 95%, Ki67 20%, HER2 2+ by IHC, FISH negative ratio 1.17      MEDICAL HISTORY:  Past Medical History:  Diagnosis Date   Cancer (HCC) 12/2016   right breast cancer   Complication of anesthesia    Personal history of radiation therapy    PONV (postoperative nausea and vomiting)     SURGICAL HISTORY: Past Surgical History:  Procedure Laterality Date   APPENDECTOMY     BREAST BIOPSY Right 06/15/2023   [place] RT BREAST BX W LOC DEV 1ST LESION IMG BX SPEC US GUIDE 06/15/2023 [facility] MAMMOGR[facility]   BREAST LUMPECTOMY Right 01/2017   CHOLECYSTECTOMY     RADIOACTIVE SEED GUIDED PARTIAL MASTECTOMY WITH AXILLARY SENTINEL LYMPH NODE BIOPSY Right 01/19/2017   Procedure: RIGHT BREAST RADIOACTIVE SEED GUIDED PARTIAL MASTECTOMY WITH RIGHT  SENTINEL LYMPH NODE MAPPING;  Surgeon: Harriette Bouillon, MD;  Location: [facility];  Service: General;  Laterality: Right;   TUBAL LIGATION      SOCIAL HISTORY: Social History   Socioeconomic History   Marital status: Married    Spouse name: Not on file   Number of children: Not on file   Years of education: Not on file   Highest education level: Not on file  Occupational History  Not on file  Tobacco Use   Smoking status: Never   Smokeless tobacco: Never  Substance and Sexual Activity   Alcohol use: No   Drug use: No   Sexual activity: Not on file  Other Topics Concern   Not on file  Social History Narrative   Not on file   Social Determinants of Health   Financial Resource Strain: Not on file  Food Insecurity: Not on file  Transportation Needs: Not on file  Physical Activity: Not on file   Stress: Not on file  Social Connections: Not on file  Intimate Partner Violence: Not on file    FAMILY HISTORY: Family History  Problem Relation Age of Onset   Breast cancer Maternal Aunt     ALLERGIES:  has No Known Allergies.  MEDICATIONS:  Current Outpatient Medications  Medication Sig Dispense Refill   acetaminophen (TYLENOL) 325 MG tablet Take 650 mg by mouth every 6 (six) hours as needed.     ibuprofen (ADVIL,MOTRIN) 800 MG tablet Take 1 tablet (800 mg total) by mouth every 8 (eight) hours as needed. 30 tablet 0   oxyCODONE (OXY IR/ROXICODONE) 5 MG immediate release tablet Take 1-2 tablets (5-10 mg total) by mouth every 6 (six) hours as needed for severe pain. 12 tablet 0   No current facility-administered medications for this visit.    REVIEW OF SYSTEMS:   Constitutional: Denies fevers, chills or abnormal night sweats  All other systems were reviewed with the patient and are negative.  PHYSICAL EXAMINATION: ECOG PERFORMANCE STATUS: 0 - Asymptomatic  Vitals:   06/26/23 1207 06/26/23 1208  BP: (!) 156/68 (!) 152/66  Pulse: 80   Resp: 18   Temp: 97.9 F (36.6 C)   SpO2: 99%    Filed Weights   06/26/23 1207  Weight: 217 lb (98.4 kg)    GENERAL:alert, no distress and comfortable    LABORATORY DATA:  I have reviewed the data as listed Lab Results  Component Value Date   WBC 6.9 01/13/2017   HGB 13.8 01/13/2017   HCT 41.5 01/13/2017   MCV 87.4 01/13/2017   PLT 239 01/13/2017   Lab Results  Component Value Date   NA 136 01/13/2017   K 4.8 01/13/2017   CL 104 01/13/2017   CO2 28 01/13/2017     ASSESSMENT AND PLAN:  Malignant neoplasm of upper-outer quadrant of right breast in female, estrogen receptor positive (HCC) 12/25/2016:Right breast biopsy 10:00 position: IDC with DCIS grade 1, ER 90%, PR 90%, Ki-67 3%, HER-2 negative ratio 1.38, screening mammogram revealed a 1.5 cm right UOQ mass, T1c N0 stage IA clinical stage 01/19/2017:Right lumpectomy:  IDC grade 1, 2.1 cm, DCIS grade 1, margins negative, 0/2 lymph nodes negative, ER 90%, PR 90%, HER-2 negative, Ki-67 3%, T2 N0 M0 stage II a,   Oncotype DX score 20: Risk of recurrence 13%  Adjuvant radiation therapy completed in September 2018 Refused antiestrogen therapy --------------------------------------------------------------------------------------------------------------------------- 06/15/2023: Recurrence of right breast cancer: Mammogram detected 0.8 cm mass, axilla negative, biopsy: Grade 2 IDC ER 95%, PR 95%, HER2 2+ by IHC, FISH negative ratio 1.17, Ki-67 20%  Pathology and radiology counseling: Discussed with the patient, the details of pathology including the type of breast cancer,the clinical staging, the significance of ER, PR and HER-2/neu receptors and the implications for treatment. After reviewing the pathology in detail, we proceeded to discuss the different treatment options between surgery, radiation, chemotherapy, antiestrogen therapies.  Treatment plan: Mastectomy Adjuvant antiestrogen therapy with Anastrozole 1 mg  daily X 5-7 years  Refused genetic testing at this time. Return to clinic after surgery.      All questions were answered. The patient knows to call the clinic with any problems, questions or concerns.    Tamsen Meek, MD 06/26/23

## 2023-06-29 ENCOUNTER — Encounter: Payer: Self-pay | Admitting: *Deleted

## 2023-07-06 ENCOUNTER — Encounter: Payer: Self-pay | Admitting: *Deleted

## 2023-07-07 ENCOUNTER — Telehealth: Payer: Self-pay | Admitting: Licensed Clinical Social Worker

## 2023-07-07 NOTE — Telephone Encounter (Signed)
[facility] Clinical Social Work  Clinical Social Work was referred by new patient protocol for assessment of psychosocial needs.  Clinical Social Worker attempted to contact patient by phone  to offer support and assess for needs.   No answer. Left VM with direct contact information and brief description of support services.     Micayla Brathwaite E ZAVALA, LCSW  Clinical Social Worker [facility]        

## 2023-07-28 ENCOUNTER — Encounter (HOSPITAL_BASED_OUTPATIENT_CLINIC_OR_DEPARTMENT_OTHER)
Admission: RE | Admit: 2023-07-28 | Discharge: 2023-07-28 | Disposition: A | Discharge status: Home / Self Care | Payer: BC Managed Care – PPO | Source: Ambulatory Visit | Attending: Surgery | Admitting: Surgery

## 2023-07-28 ENCOUNTER — Other Ambulatory Visit: Payer: Self-pay

## 2023-07-28 ENCOUNTER — Encounter (HOSPITAL_BASED_OUTPATIENT_CLINIC_OR_DEPARTMENT_OTHER): Payer: Self-pay

## 2023-07-28 DIAGNOSIS — Z01818 Encounter for other preprocedural examination: Secondary | ICD-10-CM | POA: Diagnosis present

## 2023-07-28 LAB — BASIC METABOLIC PANEL
Anion gap: 9 (ref 5–15)
BUN: 7 mg/dL (ref 6–20)
CO2: 25 mmol/L (ref 22–32)
Calcium: 10.6 mg/dL — ABNORMAL HIGH (ref 8.9–10.3)
Chloride: 104 mmol/L (ref 98–111)
Creatinine, Ser: 0.54 mg/dL (ref 0.44–1.00)
GFR, Estimated: >60 mL/min (ref >60)
Glucose, Bld: 103 mg/dL — ABNORMAL HIGH (ref 70–99)
Potassium: 4.3 mmol/L (ref 3.5–5.1)
Sodium: 138 mmol/L (ref 135–145)

## 2023-07-28 MED ORDER — CHLORHEXIDINE GLUCONATE CLOTH 2 % EX PADS
6.0000 | MEDICATED_PAD | Freq: Once | CUTANEOUS | Status: DC
Start: 2023-07-28 — End: 2023-08-05

## 2023-07-28 NOTE — Progress Notes (Signed)

## 2023-08-05 ENCOUNTER — Encounter (HOSPITAL_BASED_OUTPATIENT_CLINIC_OR_DEPARTMENT_OTHER): Payer: Self-pay | Admitting: Surgery

## 2023-08-05 ENCOUNTER — Other Ambulatory Visit: Payer: Self-pay

## 2023-08-05 ENCOUNTER — Ambulatory Visit (HOSPITAL_BASED_OUTPATIENT_CLINIC_OR_DEPARTMENT_OTHER): Payer: BC Managed Care – PPO | Admitting: Anesthesiology

## 2023-08-05 ENCOUNTER — Ambulatory Visit (HOSPITAL_BASED_OUTPATIENT_CLINIC_OR_DEPARTMENT_OTHER)
Admission: RE | Admit: 2023-08-05 | Discharge: 2023-08-06 | Disposition: A | Discharge status: Home / Self Care | Payer: BC Managed Care – PPO | Attending: Surgery | Admitting: Surgery

## 2023-08-05 ENCOUNTER — Encounter (HOSPITAL_BASED_OUTPATIENT_CLINIC_OR_DEPARTMENT_OTHER)
Admission: RE | Disposition: A | Discharge status: Home / Self Care | Payer: Self-pay | Source: Home / Self Care | Attending: Surgery

## 2023-08-05 DIAGNOSIS — C50411 Malignant neoplasm of upper-outer quadrant of right female breast: Secondary | ICD-10-CM | POA: Diagnosis present

## 2023-08-05 DIAGNOSIS — Z01818 Encounter for other preprocedural examination: Secondary | ICD-10-CM

## 2023-08-05 DIAGNOSIS — Z923 Personal history of irradiation: Secondary | ICD-10-CM | POA: Diagnosis not present

## 2023-08-05 DIAGNOSIS — E119 Type 2 diabetes mellitus without complications: Secondary | ICD-10-CM | POA: Diagnosis not present

## 2023-08-05 DIAGNOSIS — Z9221 Personal history of antineoplastic chemotherapy: Secondary | ICD-10-CM | POA: Insufficient documentation

## 2023-08-05 DIAGNOSIS — Z1721 Progesterone receptor positive status: Secondary | ICD-10-CM | POA: Diagnosis not present

## 2023-08-05 DIAGNOSIS — Z170 Estrogen receptor positive status [ER+]: Secondary | ICD-10-CM | POA: Diagnosis not present

## 2023-08-05 HISTORY — PX: MASTECTOMY W/ SENTINEL NODE BIOPSY: SHX2001

## 2023-08-05 SURGERY — MASTECTOMY WITH SENTINEL LYMPH NODE BIOPSY
Anesthesia: Regional | Site: Breast | Laterality: Right

## 2023-08-05 MED ORDER — ONDANSETRON HCL 4 MG/2ML IJ SOLN
INTRAMUSCULAR | Status: AC
Start: 2023-08-05 — End: ?
  Filled 2023-08-05: qty 2

## 2023-08-05 MED ORDER — DEXAMETHASONE SODIUM PHOSPHATE 10 MG/ML IJ SOLN
INTRAMUSCULAR | Status: AC
Start: 2023-08-05 — End: ?
  Filled 2023-08-05: qty 1

## 2023-08-05 MED ORDER — SODIUM CHLORIDE 0.9% FLUSH: 3.0000 mL | Freq: Two times a day (BID) | INTRAVENOUS | Status: DC

## 2023-08-05 MED ORDER — SCOPOLAMINE 1 MG/3DAYS TD PT72
MEDICATED_PATCH | TRANSDERMAL | Status: DC | PRN
  Administered 2023-08-05: 1 via TRANSDERMAL

## 2023-08-05 MED ORDER — DEXAMETHASONE SODIUM PHOSPHATE 10 MG/ML IJ SOLN
INTRAMUSCULAR | Status: AC
  Filled 2023-08-05: qty 1

## 2023-08-05 MED ORDER — FENTANYL CITRATE (PF) 100 MCG/2ML IJ SOLN
INTRAMUSCULAR | Status: DC | PRN
  Administered 2023-08-05 (×2): 50 ug via INTRAVENOUS
  Administered 2023-08-05: 100 ug via INTRAVENOUS

## 2023-08-05 MED ORDER — BUPIVACAINE-EPINEPHRINE (PF) 0.5% -1:200000 IJ SOLN
INTRAMUSCULAR | Status: DC | PRN
  Administered 2023-08-05: 30 mL via PERINEURAL

## 2023-08-05 MED ORDER — LABETALOL HCL 5 MG/ML IV SOLN
INTRAVENOUS | Status: AC
  Filled 2023-08-05: qty 4

## 2023-08-05 MED ORDER — FENTANYL CITRATE (PF) 100 MCG/2ML IJ SOLN
INTRAMUSCULAR | Status: AC
  Filled 2023-08-05: qty 2

## 2023-08-05 MED ORDER — CEFAZOLIN SODIUM-DEXTROSE 2-4 GM/100ML-% IV SOLN
2.0000 g | INTRAVENOUS | Status: AC
  Administered 2023-08-05: 2 g via INTRAVENOUS

## 2023-08-05 MED ORDER — MIDAZOLAM HCL 2 MG/2ML IJ SOLN
INTRAMUSCULAR | Status: AC
Start: 2023-08-05 — End: ?
  Filled 2023-08-05: qty 2

## 2023-08-05 MED ORDER — ACETAMINOPHEN 500 MG PO TABS
ORAL_TABLET | ORAL | Status: AC
  Filled 2023-08-05: qty 2

## 2023-08-05 MED ORDER — TRANEXAMIC ACID 1000 MG/10ML IV SOLN
INTRAVENOUS | Status: AC
  Filled 2023-08-05: qty 10

## 2023-08-05 MED ORDER — ACETAMINOPHEN 325 MG RE SUPP
650.0000 mg | RECTAL | Status: DC | PRN
  Filled 2023-08-05: qty 2

## 2023-08-05 MED ORDER — ESMOLOL HCL 100 MG/10ML IV SOLN
INTRAVENOUS | Status: AC
  Filled 2023-08-05: qty 10

## 2023-08-05 MED ORDER — LACTATED RINGERS IV SOLN: INTRAVENOUS | Status: DC

## 2023-08-05 MED ORDER — TRANEXAMIC ACID 1000 MG/10ML IV SOLN
Status: DC | PRN
  Administered 2023-08-05: 3000 mg via TOPICAL

## 2023-08-05 MED ORDER — BUPIVACAINE HCL (PF) 0.25 % IJ SOLN
INTRAMUSCULAR | Status: AC
  Filled 2023-08-05: qty 30

## 2023-08-05 MED ORDER — CEFAZOLIN SODIUM-DEXTROSE 2-4 GM/100ML-% IV SOLN
INTRAVENOUS | Status: AC
  Filled 2023-08-05: qty 100

## 2023-08-05 MED ORDER — SODIUM CHLORIDE 0.9% FLUSH: 3.0000 mL | INTRAVENOUS | Status: DC | PRN

## 2023-08-05 MED ORDER — TRANEXAMIC ACID 1000 MG/10ML IV SOLN
INTRAVENOUS | Status: AC
  Filled 2023-08-05: qty 20

## 2023-08-05 MED ORDER — METHYLENE BLUE (ANTIDOTE) 1 % IV SOLN
INTRAVENOUS | Status: AC
  Filled 2023-08-05: qty 10

## 2023-08-05 MED ORDER — ACETAMINOPHEN 500 MG PO TABS
1000.0000 mg | ORAL_TABLET | Freq: Once | ORAL | Status: AC
Start: 2023-08-05 — End: 2023-08-05
  Administered 2023-08-05: 1000 mg via ORAL

## 2023-08-05 MED ORDER — MORPHINE SULFATE (PF) 4 MG/ML IV SOLN: 1.0000 mg | INTRAVENOUS | Status: DC | PRN

## 2023-08-05 MED ORDER — ACETAMINOPHEN 325 MG PO TABS
650.0000 mg | ORAL_TABLET | ORAL | Status: DC | PRN
  Administered 2023-08-05 – 2023-08-06 (×3): 650 mg via ORAL
  Filled 2023-08-05 (×3): qty 2

## 2023-08-05 MED ORDER — EPHEDRINE 5 MG/ML INJ
INTRAVENOUS | Status: AC
  Filled 2023-08-05: qty 5

## 2023-08-05 MED ORDER — OXYCODONE HCL 5 MG PO TABS
5.0000 mg | ORAL_TABLET | ORAL | Status: DC | PRN
Start: 2023-08-05 — End: 2023-08-06

## 2023-08-05 MED ORDER — ONDANSETRON HCL 4 MG/2ML IJ SOLN
INTRAMUSCULAR | Status: DC | PRN
  Administered 2023-08-05: 4 mg via INTRAVENOUS

## 2023-08-05 MED ORDER — MAGTRACE LYMPHATIC TRACER
INTRAMUSCULAR | Status: DC | PRN
  Administered 2023-08-05: 2 mL via INTRAMUSCULAR

## 2023-08-05 MED ORDER — OXYCODONE HCL 5 MG/5ML PO SOLN: 5.0000 mg | Freq: Once | ORAL | Status: DC | PRN

## 2023-08-05 MED ORDER — HYDRALAZINE HCL 20 MG/ML IJ SOLN
INTRAMUSCULAR | Status: DC | PRN
  Administered 2023-08-05 (×2): 10 mg via INTRAVENOUS

## 2023-08-05 MED ORDER — LIDOCAINE 2% (20 MG/ML) 5 ML SYRINGE
INTRAMUSCULAR | Status: DC | PRN
  Administered 2023-08-05: 40 mg via INTRAVENOUS

## 2023-08-05 MED ORDER — PROPOFOL 1000 MG/100ML IV EMUL
INTRAVENOUS | Status: AC
  Filled 2023-08-05: qty 200

## 2023-08-05 MED ORDER — FENTANYL CITRATE (PF) 100 MCG/2ML IJ SOLN: 25.0000 ug | INTRAMUSCULAR | Status: DC | PRN

## 2023-08-05 MED ORDER — SODIUM CHLORIDE 0.9 % IV SOLN: INTRAVENOUS | Status: DC | PRN

## 2023-08-05 MED ORDER — ONDANSETRON HCL 4 MG/2ML IJ SOLN: 4.0000 mg | INTRAMUSCULAR | Status: DC | PRN

## 2023-08-05 MED ORDER — PROPOFOL 500 MG/50ML IV EMUL
INTRAVENOUS | Status: AC
  Filled 2023-08-05: qty 50

## 2023-08-05 MED ORDER — OXYCODONE HCL 5 MG PO TABS: 5.0000 mg | ORAL_TABLET | Freq: Once | ORAL | Status: DC | PRN

## 2023-08-05 MED ORDER — SODIUM CHLORIDE 0.9 % IV SOLN: 250.0000 mL | INTRAVENOUS | Status: DC | PRN

## 2023-08-05 MED ORDER — DEXTROSE-SODIUM CHLORIDE 5-0.9 % IV SOLN: INTRAVENOUS | Status: DC

## 2023-08-05 MED ORDER — METHOCARBAMOL 500 MG PO TABS: 500.0000 mg | ORAL_TABLET | Freq: Three times a day (TID) | ORAL | Status: DC | PRN

## 2023-08-05 MED ORDER — SCOPOLAMINE 1 MG/3DAYS TD PT72
MEDICATED_PATCH | TRANSDERMAL | Status: AC
  Filled 2023-08-05: qty 1

## 2023-08-05 MED ORDER — HYDROMORPHONE HCL 1 MG/ML IJ SOLN
INTRAMUSCULAR | Status: AC
  Filled 2023-08-05: qty 0.5

## 2023-08-05 MED ORDER — ESMOLOL HCL 100 MG/10ML IV SOLN
INTRAVENOUS | Status: DC | PRN
  Administered 2023-08-05: 30 mg via INTRAVENOUS
  Administered 2023-08-05 (×2): 20 mg via INTRAVENOUS
  Administered 2023-08-05: 30 mg via INTRAVENOUS

## 2023-08-05 MED ORDER — FENTANYL CITRATE (PF) 100 MCG/2ML IJ SOLN: 100.0000 ug | Freq: Once | INTRAMUSCULAR | Status: DC

## 2023-08-05 MED ORDER — MIDAZOLAM HCL 2 MG/2ML IJ SOLN
INTRAMUSCULAR | Status: AC
  Filled 2023-08-05: qty 2

## 2023-08-05 MED ORDER — OXYMETAZOLINE HCL 0.05 % NA SOLN
NASAL | Status: AC
  Filled 2023-08-05: qty 30

## 2023-08-05 MED ORDER — LABETALOL HCL 5 MG/ML IV SOLN
INTRAVENOUS | Status: DC | PRN
  Administered 2023-08-05 (×2): 10 mg via INTRAVENOUS

## 2023-08-05 MED ORDER — LIDOCAINE 2% (20 MG/ML) 5 ML SYRINGE
INTRAMUSCULAR | Status: AC
  Filled 2023-08-05: qty 5

## 2023-08-05 MED ORDER — AMISULPRIDE (ANTIEMETIC) 5 MG/2ML IV SOLN: 10.0000 mg | Freq: Once | INTRAVENOUS | Status: DC | PRN

## 2023-08-05 MED ORDER — HYDROMORPHONE HCL 1 MG/ML IJ SOLN
INTRAMUSCULAR | Status: DC | PRN
  Administered 2023-08-05: .5 mg via INTRAVENOUS

## 2023-08-05 MED ORDER — PHENYLEPHRINE 80 MCG/ML (10ML) SYRINGE FOR IV PUSH (FOR BLOOD PRESSURE SUPPORT)
PREFILLED_SYRINGE | INTRAVENOUS | Status: AC
  Filled 2023-08-05: qty 10

## 2023-08-05 MED ORDER — PROPOFOL 10 MG/ML IV BOLUS
INTRAVENOUS | Status: AC
Start: 2023-08-05 — End: ?
  Filled 2023-08-05: qty 20

## 2023-08-05 MED ORDER — OXYCODONE HCL 5 MG PO TABS: 5.0000 mg | ORAL_TABLET | Freq: Four times a day (QID) | ORAL | 0 refills | Status: AC | PRN

## 2023-08-05 MED ORDER — MIDAZOLAM HCL 2 MG/2ML IJ SOLN
2.0000 mg | Freq: Once | INTRAMUSCULAR | Status: AC
  Administered 2023-08-05: 2 mg via INTRAVENOUS

## 2023-08-05 MED ORDER — PROPOFOL 10 MG/ML IV BOLUS
INTRAVENOUS | Status: DC | PRN
  Administered 2023-08-05: 150 ug via INTRAVENOUS
  Administered 2023-08-05: 150 ug/kg/min via INTRAVENOUS

## 2023-08-05 MED ORDER — KETOROLAC TROMETHAMINE 30 MG/ML IJ SOLN: 30.0000 mg | Freq: Once | INTRAMUSCULAR | Status: AC | PRN

## 2023-08-05 MED ORDER — PROPOFOL 10 MG/ML IV BOLUS
INTRAVENOUS | Status: AC
  Filled 2023-08-05: qty 20

## 2023-08-05 MED ORDER — BUPIVACAINE-EPINEPHRINE (PF) 0.25% -1:200000 IJ SOLN
INTRAMUSCULAR | Status: AC
  Filled 2023-08-05: qty 30

## 2023-08-05 MED ORDER — DEXAMETHASONE SODIUM PHOSPHATE 10 MG/ML IJ SOLN
INTRAMUSCULAR | Status: DC | PRN
  Administered 2023-08-05: 10 mg via INTRAVENOUS

## 2023-08-05 SURGICAL SUPPLY — 62 items
APPLIER CLIP 11 MED OPEN (CLIP) IMPLANT
APPLIER CLIP 9.375 MED OPEN (MISCELLANEOUS) ×1 IMPLANT
BAG DECANTER FOR FLEXI CONT (MISCELLANEOUS) IMPLANT
BENZOIN TINCTURE PRP APPL 2/3 (GAUZE/BANDAGES/DRESSINGS) IMPLANT
BINDER BREAST LRG (GAUZE/BANDAGES/DRESSINGS) IMPLANT
BINDER BREAST MEDIUM (GAUZE/BANDAGES/DRESSINGS) IMPLANT
BINDER BREAST XLRG (GAUZE/BANDAGES/DRESSINGS) IMPLANT
BINDER BREAST XXLRG (GAUZE/BANDAGES/DRESSINGS) IMPLANT
BIOPATCH RED 1 DISK 7.0 (GAUZE/BANDAGES/DRESSINGS) IMPLANT
BLADE HEX COATED 2.75 (ELECTRODE) ×1 IMPLANT
BLADE SURG 10 STRL SS (BLADE) ×1 IMPLANT
BLADE SURG 15 STRL LF DISP TIS (BLADE) ×1 IMPLANT
CANISTER SUCT 1200ML W/VALVE (MISCELLANEOUS) ×1 IMPLANT
CHLORAPREP W/TINT 26 (MISCELLANEOUS) ×1 IMPLANT
CLIP APPLIE 11 MED OPEN (CLIP) IMPLANT
CLIP APPLIE 9.375 MED OPEN (MISCELLANEOUS) ×1 IMPLANT
COVER BACK TABLE 60X90IN (DRAPES) ×1 IMPLANT
COVER MAYO STAND STRL (DRAPES) ×1 IMPLANT
COVER PROBE CYLINDRICAL 5X96 (MISCELLANEOUS) ×1 IMPLANT
DERMABOND ADVANCED .7 DNX12 (GAUZE/BANDAGES/DRESSINGS) ×2 IMPLANT
DRAIN CHANNEL 19F RND (DRAIN) ×1 IMPLANT
DRAPE LAPAROSCOPIC ABDOMINAL (DRAPES) ×1 IMPLANT
DRAPE UTILITY XL STRL (DRAPES) IMPLANT
DRSG TEGADERM 2-3/8X2-3/4 SM (GAUZE/BANDAGES/DRESSINGS) IMPLANT
DRSG TEGADERM 4X10 (GAUZE/BANDAGES/DRESSINGS) ×1 IMPLANT
ELECT REM PT RETURN 9FT ADLT (ELECTROSURGICAL) ×1 IMPLANT
ELECTRODE REM PT RTRN 9FT ADLT (ELECTROSURGICAL) ×1 IMPLANT
EVACUATOR SILICONE 100CC (DRAIN) ×1 IMPLANT
GAUZE PAD ABD 8X10 STRL (GAUZE/BANDAGES/DRESSINGS) IMPLANT
GAUZE SPONGE 4X4 12PLY STRL LF (GAUZE/BANDAGES/DRESSINGS) IMPLANT
GLOVE BIOGEL PI IND STRL 8 (GLOVE) ×1 IMPLANT
GLOVE ECLIPSE 8.0 STRL XLNG CF (GLOVE) ×1 IMPLANT
GOWN STRL REUS W/ TWL LRG LVL3 (GOWN DISPOSABLE) ×2 IMPLANT
GOWN STRL REUS W/ TWL XL LVL3 (GOWN DISPOSABLE) ×1 IMPLANT
HEMOSTAT ARISTA ABSORB 3G PWDR (HEMOSTASIS) IMPLANT
LIGHT WAVEGUIDE WIDE FLAT (MISCELLANEOUS) IMPLANT
NDL HYPO 25X1 1.5 SAFETY (NEEDLE) ×2 IMPLANT
NDL SAFETY ECLIPSE 18X1.5 (NEEDLE) ×1 IMPLANT
NEEDLE HYPO 25X1 1.5 SAFETY (NEEDLE) ×2 IMPLANT
NS IRRIG 1000ML POUR BTL (IV SOLUTION) IMPLANT
PACK BASIN DAY SURGERY FS (CUSTOM PROCEDURE TRAY) ×1 IMPLANT
PENCIL SMOKE EVACUATOR (MISCELLANEOUS) ×1 IMPLANT
PIN SAFETY STERILE (MISCELLANEOUS) ×1 IMPLANT
SLEEVE SCD COMPRESS KNEE MED (STOCKING) ×1 IMPLANT
SPIKE FLUID TRANSFER (MISCELLANEOUS) IMPLANT
SPONGE T-LAP 18X18 ~~LOC~~+RFID (SPONGE) ×1 IMPLANT
SPONGE T-LAP 4X18 ~~LOC~~+RFID (SPONGE) IMPLANT
STAPLER SKIN PROX WIDE 3.9 (STAPLE) ×1 IMPLANT
STRIP CLOSURE SKIN 1/2X4 (GAUZE/BANDAGES/DRESSINGS) IMPLANT
SUT CHROMIC 3 0 SH 27 (SUTURE) IMPLANT
SUT ETHILON 2 0 FS 18 (SUTURE) ×1 IMPLANT
SUT MNCRL AB 3-0 PS2 18 (SUTURE) ×1 IMPLANT
SUT MON AB 4-0 PC3 18 (SUTURE) IMPLANT
SUT SILK 2 0 PERMA HAND 18 BK (SUTURE) ×1 IMPLANT
SUT SILK 2 0 SH (SUTURE) IMPLANT
SUT VIC AB 3-0 54X BRD REEL (SUTURE) ×1 IMPLANT
SUT VICRYL 3-0 CR8 SH (SUTURE) ×2 IMPLANT
SYR CONTROL 10ML LL (SYRINGE) ×2 IMPLANT
TOWEL GREEN STERILE FF (TOWEL DISPOSABLE) ×2 IMPLANT
TRACER MAGTRACE VIAL (MISCELLANEOUS) IMPLANT
TUBE CONNECTING 20X1/4 (TUBING) ×1 IMPLANT
YANKAUER SUCT BULB TIP NO VENT (SUCTIONS) ×1 IMPLANT

## 2023-08-05 NOTE — Anesthesia Procedure Notes (Signed)
 Anesthesia Regional Block: Pectoralis block   Pre-Anesthetic Checklist: , timeout performed,  Correct Patient, Correct Site, Correct Laterality,  Correct Procedure, Correct Position, site marked,  Risks and benefits discussed,  Surgical consent,  Pre-op evaluation,  At surgeon's request and post-op pain management  Laterality: Right  Prep: chloraprep       Needles:  Injection technique: Single-shot  Needle Type: Echogenic Stimulator Needle     Needle Length: 10cm  Needle Gauge: 20     Additional Needles:   Procedures:,,,, ultrasound used (permanent image in chart),,    Narrative:  Start time: 08/05/2023 9:10 AM End time: 08/05/2023 9:20 AM Injection made incrementally with aspirations every 5 mL.  Performed by: Personally  Anesthesiologist: Peggi Bowels, MD  Additional Notes: Functioning IV was confirmed and monitors were applied.  A timeout was performed. Sterile prep, hand hygiene and sterile gloves were used. A 20ga Bbraun echogenic stimulator needle was used. Negative aspiration and negative test dose prior to incremental administration of local anesthetic. The patient tolerated the procedure well.  Ultrasound guidance: relevent anatomy identified, needle position confirmed, local anesthetic spread visualized around nerve(s), vascular puncture avoided.  Image printed for medical record.

## 2023-08-05 NOTE — Interval H&P Note (Signed)
 History and Physical Interval Note:  08/05/2023 9:18 AM  Katelyn Villanueva  has presented today for surgery, with the diagnosis of RIGHT BREAST CANCER.  The various methods of treatment have been discussed with the patient and family. After consideration of risks, benefits and other options for treatment, the patient has consented to  Procedure(s) with comments: RIGHT SIMPLE MASTECTOMY, RIGHT SENTINEL LYMPH NODE MAPPING (Right) - PEC BLOCK as a surgical intervention.  The patient's history has been reviewed, patient examined, no change in status, stable for surgery.  I have reviewed the patient's chart and labs.  Questions were answered to the patient's satisfaction.    The surgical and non surgical options have been discussed with the patient.  Risks of surgery include bleeding,  Infection,  Flap necrosis,  Tissue loss,  Chronic pain, death, Numbness,  And the need for additional procedures.  Reconstruction options also have been discussed with the patient as well.  The patient agrees to proceed. Sentinel lymph node mapping and dissection has been discussed with the patient.  Risk of bleeding,  Infection,  Seroma formation,  Additional procedures,,  Shoulder weakness ,  Shoulder stiffness,  Nerve and blood vessel injury and reaction to the mapping dyes have been discussed.  Alternatives to surgery have been discussed with the patient.  The patient agrees to proceed.  Katelyn Villanueva

## 2023-08-05 NOTE — Transfer of Care (Signed)
 Immediate Anesthesia Transfer of Care Note  Patient: Katelyn Villanueva  Procedure(s) Performed: RIGHT SIMPLE MASTECTOMY, RIGHT SENTINEL LYMPH NODE MAPPING (Right: Breast)  Patient Location: PACU  Anesthesia Type:General and Regional  Level of Consciousness: awake  Airway & Oxygen Therapy: Patient Spontanous Breathing and Patient connected to face mask oxygen  Post-op Assessment: Report given to RN and Post -op Vital signs reviewed and stable  Post vital signs: Reviewed and stable  Last Vitals:  Vitals Value Taken Time  BP 144/89 08/05/23 1145  Temp    Pulse 98 08/05/23 1146  Resp 16 08/05/23 1146  SpO2 100 % 08/05/23 1146  Vitals shown include unfiled device data.  Last Pain:  Vitals:   08/05/23 0758  TempSrc: Temporal  PainSc: 0-No pain      Patients Stated Pain Goal: 3 (08/05/23 0758)  Complications: No notable events documented.

## 2023-08-05 NOTE — Progress Notes (Addendum)
 Patients lateral right JP drain has no output since surgery, Jp drain is holding suction well, Vital signs stable. Dr Florentina Huntsman office notified and spoke to [facility]  the on call physician no new orders at this time.

## 2023-08-05 NOTE — Progress Notes (Signed)
Assisted Dr. Ellender with right, pectoralis, ultrasound guided block. Side rails up, monitors on throughout procedure. See vital signs in flow sheet. Tolerated Procedure well. ?

## 2023-08-05 NOTE — H&P (Signed)
 Chief Complaint: Breast Cancer  History of Present Illness: Katelyn Villanueva is a 58 y.o. female who is seen today as an office consultation for evaluation of Breast Cancer  58 year old female with history of right breast cancer in 2018 treated with breast conserving surgery, radiation therapy and antiestrogen therapy. She was doing yoga and felt some discomfort in her right breast earlier this year. 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 She has some swelling and pain at the biopsy site.  Review of Systems: A complete review of systems was obtained from the patient. I have reviewed this information and discussed as appropriate with the patient. See HPI as well for other ROS.    Medical History: Past Medical History:  Diagnosis Date  Diabetes mellitus without complication (CMS/HHS-HCC)  History of cancer   There is no problem list on file for this patient.  Past Surgical History:  Procedure Laterality Date  CHOLECYSTECTOMY  MASTECTOMY PARTIAL / LUMPECTOMY    Allergies  Allergen Reactions  Prednisone Rash, Shortness Of Breath and Other (See Comments)   Current Outpatient Medications on File Prior to Visit  Medication Sig Dispense Refill  ergocalciferol, vitamin D2, 1,250 mcg (50,000 unit) capsule Take 50,000 Units by mouth once a week  ascorbic acid (VITAMIN C) 500 MG tablet 1 tab by mouth 2 times a day  evening primrose oil (EVENING PRIMROSE) 500 mg Cap 1 cap by mouth 3 times a day  fish,bora,flax oils-om3,6,9 #1 (TRIPLE OMEGA 3-6-9) 400-400-400 mg Cap 1 cap by mouth 3 times a day  promethazine (PHENERGAN) 25 MG tablet   No current facility-administered medications on file prior to visit.   Family History  Problem Relation Age of Onset  High blood pressure (Hypertension) Mother  Deep vein  thrombosis (DVT or abnormal blood clot formation) Father  Tuberculosis Father  Diabetes Sister  Anuerysm Sister    Social History   Tobacco Use  Smoking Status Never  Smokeless Tobacco Never    Social History   Socioeconomic History  Marital status: Married  Tobacco Use  Smoking status: Never  Smokeless tobacco: Never   Objective:   Vitals:  06/26/23 0937  Weight: 98.5 kg (217 lb 3.2 oz)  Height: 167.6 cm (5\' 6" )  PainSc: 0-No pain   Body mass index is 35.06 kg/m.  Physical Exam Exam conducted with a chaperone present.  HENT:  Head: Normocephalic.  Cardiovascular:  Rate and Rhythm: Normal rate.  Pulmonary:  Effort: Pulmonary effort is normal.  Chest:  Breasts: Left: Normal.   Comments: Large keloid upper central chest noted Hematoma noted right breast upper outer quadrant. Previous scars noted. Radiation tattoos noted. Musculoskeletal:  General: Normal range of motion.  Lymphadenopathy:  Upper Body:  Right upper body: No axillary adenopathy.  Left upper body: No axillary adenopathy.  Skin: General: Skin is warm.  Neurological:  General: No focal deficit present.  Mental Status: She is alert.  Psychiatric:  Mood and Affect: Mood normal.     Labs, Imaging and Diagnostic Testing:  CLINICAL DATA: 58 year old female presents for ultrasound-guided biopsy of 0.8 cm OUTER RIGHT breast mass. History of RIGHT breast cancer and lumpectomy.  EXAM: ULTRASOUND GUIDED RIGHT BREAST CORE NEEDLE BIOPSY  COMPARISON: Previous exam(s).  PROCEDURE: I met with the patient and we discussed the procedure of ultrasound-guided biopsy,  including benefits and alternatives. We discussed the high likelihood of a successful procedure. We discussed the risks of the procedure, including infection, bleeding, tissue injury, clip migration, and inadequate sampling. Informed written consent was given. The usual time-out protocol was performed immediately prior to the  procedure.  Using sterile technique and 1% Lidocaine  as local anesthetic, under direct ultrasound visualization, a 14 gauge spring-loaded device was used to perform biopsy of 0.8 cm mass at the 9 o'clock position of the RIGHT breast using a SUPERIOR approach. At the conclusion of the procedure a COIL shaped tissue marker clip was deployed into the biopsy cavity. Follow up 2 view mammogram was performed and dictated separately.  IMPRESSION: Ultrasound guided biopsy of 0.8 cm OUTER RIGHT breast mass. No apparent complications.  Electronically Signed: By: Sundra Engel M.D. On: 06/15/2023 12:03 Addendum  Addendum by Sundra Engel, MD on 06/16/2023 3:27 PM EST ADDENDUM REPORT: 06/16/2023 13:27  ADDENDUM: Pathology revealed GRADE II INVASIVE DUCTAL CARCINOMA of the RIGHT breast, 9 o'clock, 8 cmfn, (coil clip). This was found to be concordant by Dr. Severiano Danish.  Pathology results were discussed with the patient by telephone. The patient reported doing well after the biopsy with tenderness at the site. Post biopsy instructions and care were reviewed and questions were answered. The patient was encouraged to call [facility] for any additional concerns. My direct phone number was provided.  Surgical consultation has been arranged with Dr. Sim Dryer, per patient request, at [facility] on June 22, 2023.  Medical oncology consultation has been requested with Dr. Vinay Gudena, per patient request, at [facility].  Pathology results reported by Kraig Peru, RN on 06/16/2023.  Electronically Signed By: Sundra Engel M.D. On: 06/16/2023 13:27  FINAL DIAGNOSIS  1. Breast, right, needle core biopsy, 9:00 8 cmfn : - INVASIVE DUCTAL CARCINOMA, SEE NOTE - TUBULE FORMATION: SCORE 3 - NUCLEAR PLEOMORPHISM: SCORE 2 - MITOTIC COUNT: SCORE 1 - TOTAL SCORE: 6 - OVERALL GRADE: 2 - LYMPHOVASCULAR INVASION: NOT IDENTIFIED - CANCER  LENGTH: 0.8 CM - CALCIFICATIONS: NOT IDENTIFIED - OTHER FINDINGS: NONE  Diagnosis Note : Dr. Almeda Jacobs reviewed the case and concurs with the interpretation. A breast prognostic profile (ER, PR, Ki-67 and HER2) is pending and will be reported in an addendum. [facility] was notified on 06/16/2023.  DATE SIGNED OUT: 06/16/2023 ELECTRONIC SIGNATURE : Bernetta Brilliant Md, Nilesh, Pathologist, Electronic Signature  MICROSCOPIC DESCRIPTION  CASE COMMENTS STAINS USED IN DIAGNOSIS: H&E-2 H&E-3 H&E-4 H&E *RECUT 1 SLIDE Stains used in diagnosis 1 Her2 by IHC, 1 ER-ACIS, 1 KI-67-ACIS, 1 PR-ACIS, 1 Her2 FISH IHC scores are reported using ASCO/CAP scoring criteria. An IHC Score of 0 or 1+ is NEGATIVE for HER2, 3+ is POSITIVE for HER2, and 2+ is EQUIVOCAL. Equivocal results are reflexed to either FISH or IHC testing. Specimens are fixed in 10% Neutral Buffered Formalin for at least 6 hours and up to 72 hours. These tests have not be validated on decalcified tissue. Results should be interpreted with caution given the possibility of false negative results on decalcified specimens. Antibody Clone for HER2 is 4B5 (PATHWAY). Some of these immunohistochemical stains may have been developed and the performance characteristics determined by [facility]. Some may not have been cleared or approved by the U.S. Food and Drug Administration. The FDA has determined that such clearance or approval is not necessary. This test is used for clinical purposes. It should not be regarded as  investigational or for research. This laboratory is certified under the Clinical Laboratory Improvement Amendments of 1988 (CLIA-88) as qualified to perform high complexity clinical laboratory testing. Estrogen receptor (6F11), immunohistochemical stains are performed on formalin fixed, paraffin embedded tissue using a 3,3"-diaminobenzidine (DAB) chromogen and Leica Bond Autostainer System. The  staining intensity of the nucleus is scored manually and is reported as the percentage of tumor cell nuclei demonstrating specific nuclear staining.Specimens are fixed in 10% Neutral Buffered Formalin for at least 6 hours and up to 72 hours. These tests have not be validated on decalcified tissue. Results should be interpreted with caution given the possibility of false negative results on decalcified specimens. Ki-67 (MM1), immunohistochemical stains are performed on formalin fixed, paraffin embedded tissue using a 3,3"-diaminobenzidine (DAB) chromogen and Leica Bond Autostainer System. The staining intensity of the nucleus is scored manually and is reported as the percentage of tumor cell nuclei demonstrating specific nuclear staining.Specimens are fixed in 10% Neutral Buffered Formalin for at least 6 hours and up to 72 hours. These tests have not be validated on decalcified tissue. Results should be interpreted with caution given the possibility of false negative results on decalcified specimens. PR progesterone receptor (16), immunohistochemical stains are performed on formalin fixed, paraffin embedded tissue using a 3,3"-diaminobenzidine (DAB) chromogen and Leica Bond Autostainer System. The staining intensity of the nucleus is scored manually and is reported as the percentage of tumor cell nuclei demonstrating specific nuclear staining.Specimens are fixed in 10% Neutral Buffered Formalin for at least 6 hours and up to 72 hours. These tests have not be validated on decalcified tissue. Results should be interpreted with caution given the possibility of false negative results on decalcified specimens. HER2 IQFISH pharmDX (code [id number]) is a direct fluorescence in-situ hybridization assay designed to quantitatively determine HER2 gene amplification in formalin-fixed, paraffin-embedded tissue specimens. Results are reported using ASCO/CAP and manufacturer's scoring criteria. GROUP 1: Ratio  of HER2/CEN 17 >=2.0 and average HER2 >=4.0 signals/cell = POSITIVE. GROUP 2: Ratio of HER2 /CEN 17 >=2.0 and average HER2 < 4.0 signals/cell with an IHC of 2+ = Review of 20 additional cells. If score is still >=2.0 and average HER2 < 4.0 = NEGATIVE. GROUP 3: Ratio of Her2/CEN 17 < 2.0 and average HER2 >=6.0 signals/cell with an IHC of 2+ = Review of 20 additional cells. If score is still < 2.0 and average HER2 >=6.0 signals/cell= POSITIVE. GROUP 4: Ratio of Her2 /CEN 17 < 2.0 and average HER2 >=4.0 and < 6.0 signals/cell with an IHC score of 2+ =Review of 20 additional cells. If score is still < 2.0 and average HER2 >=4.0 and < 6.0 signals/cell= NEGATIVE. GROUP 5: Ratio of HER2/CEN 17 < 2.0 and average HER2 < 4.0 signals/cell = NEGATIVE Specimens are fixed in 10% Neutral Buffered Formalin for at least 6 hours and up to 72 hours. These tests have not been validated on decalcified tissue. Results should be interpreted with caution given the possibility of false negative results on decalcified specimens.  ADDENDUM 1A) Breast, right, needle core biopsy, 9:00 8 cmfn PROGNOSTIC INDICATORS  Results: IMMUNOHISTOCHEMICAL AND MORPHOMETRIC ANALYSIS PERFORMED MANUALLY The tumor cells are EQUIVOCAL for Her2 (2+). Her2 by FISH will be performed and the results reported separately. Estrogen Receptor: 95%, POSITIVE, STRONG STAINING INTENSITY Progesterone Receptor: 95%, POSITIVE, STRONG STAINING INTENSITY Proliferation Marker Ki67: 20% REFERENCE RANGE ESTROGEN RECEPTOR NEGATIVE 0% POSITIVE =>1% REFERENCE RANGE PROGESTERONE RECEPTOR NEGATIVE 0% POSITIVE =>1% All controls stained appropriately [place] Md, Aron Lard , Sports administrator, International aid/development worker (  Signed 11 27 2024) 1A-Breast,right,needle core biopsy FLUORESCENCE IN-SITU HYBRIDIZATION  Results: GROUP 5: HER2 **NEGATIVE**  Equivocal form of amplification of the HER2 gene was detected in the IHC 2+ tissue sample received from this  individual. HER2 FISH was performed by a technologist and cell imaging and analysis on the BioView. RATIO OF HER2/CEN17 SIGNALS 1.17 AVERAGE HER2 COPY NUMBER PER CELL 2.10 The ratio of HER2/CEN 17 is within the range < 2.0 of HER2/CEN 17 and a copy number of HER2 signals per cell is <4.0. Arch Pathol Lab Med 1:1,2018 [place], Zhaoli, Sports administrator, International aid/development worker ( Signed [phone])  Assessment and Plan:   Diagnoses and all orders for this visit:  Breast cancer, stage 1, right (CMS/HHS-HCC)   Patient with history of stage I right breast cancer treated with breast conserving surgery 2018. She has a new cancer in the right breast 6 years out. She had radiation therapy as well. We discussed the literature and pros and cons of different approaches. She is opted for a right simple mastectomy with right axillary sentinel lymph node mapping. She has no interest in a risk reducing left mastectomy and we discussed that today as well. Pros and cons of that approach as well as overall benefit in survival reviewed. Plan is to proceed with right breast simple mastectomy. She has no interest in reconstruction. Risk of bleeding, infection, cosmetic deformity, wound complications, numbness, pain, shoulder stiffness, injury to major vascular structures, and need for other additional therapies reviewed. She is seeing medical oncology today.   Sharlee Dawes, MD   I spent a total of 61 minutes in both face-to-face and non-face-to-face activities, excluding procedures performed, for this visit on the date of this encounter.

## 2023-08-05 NOTE — Progress Notes (Signed)
 Patient finished microwaveable dinner and another apple juice

## 2023-08-05 NOTE — Discharge Instructions (Addendum)
[facility] [place] [phone]  MASTECTOMY: POST OP INSTRUCTIONS  Always review your discharge instruction sheet given to you by the facility where your surgery was performed. IF YOU HAVE DISABILITY OR FAMILY LEAVE FORMS, YOU MUST BRING THEM TO THE OFFICE FOR PROCESSING.   DO NOT GIVE THEM TO YOUR DOCTOR. A prescription for pain medication may be given to you upon discharge.  Take your pain medication as prescribed, if needed.  If narcotic pain medicine is not needed, then you may take acetaminophen (Tylenol) or ibuprofen (Advil) as needed. Take your usually prescribed medications unless otherwise directed. If you need a refill on your pain medication, please contact your pharmacy.  They will contact our office to request authorization.  Prescriptions will not be filled after 5pm or on week-ends. You should follow a light diet the first few days after arrival home, such as soup and crackers, etc.  Resume your normal diet the day after surgery. Most patients will experience some swelling and bruising on the chest and underarm.  Ice packs will help.  Swelling and bruising can take several days to resolve.  It is common to experience some constipation if taking pain medication after surgery.  Increasing fluid intake and taking a stool softener (such as Colace) will usually help or prevent this problem from occurring.  A mild laxative (Milk of Magnesia or Miralax) should be taken according to package instructions if there are no bowel movements after 48 hours. Unless discharge instructions indicate otherwise, leave your bandage dry and in place until your next appointment in 3-5 days.  You may take a limited sponge bath.  No tube baths or showers until the drains are removed.  You may have steri-strips (small skin tapes) in place directly over the incision.  These strips should be left on the skin for 7-10 days.  If your surgeon used skin glue on the incision, you may shower in 24 hours.   The glue will flake off over the next 2-3 weeks.  Any sutures or staples will be removed at the office during your follow-up visit. DRAINS:  If you have drains in place, it is important to keep a list of the amount of drainage produced each day in your drains.  Before leaving the hospital, you should be instructed on drain care.  Call our office if you have any questions about your drains. ACTIVITIES:  You may resume regular (light) daily activities beginning the next day--such as daily self-care, walking, climbing stairs--gradually increasing activities as tolerated.  You may have sexual intercourse when it is comfortable.  Refrain from any heavy lifting or straining until approved by your doctor. You may drive when you are no longer taking prescription pain medication, you can comfortably wear a seatbelt, and you can safely maneuver your car and apply brakes. RETURN TO WORK:  __________________________________________________________ You should see your doctor in the office for a follow-up appointment approximately 3-5 days after your surgery.  Your doctor's nurse will typically make your follow-up appointment when she calls you with your pathology report.  Expect your pathology report 2-3 business days after your surgery.  You may call to check if you do not hear from us after three days.   OTHER INSTRUCTIONS: ______________________________________________________________________________________________ ____________________________________________________________________________________________ WHEN TO CALL YOUR DOCTOR: Fever over 101.0 Nausea and/or vomiting Extreme swelling or bruising Continued bleeding from incision. Increased pain, redness, or drainage from the incision. The clinic staff is available to answer your questions during regular business hours.  Please don't hesitate   to call and ask to speak to one of the nurses for clinical concerns.  If you have a medical emergency, go to the  nearest emergency room or call 911.  A surgeon from [facility] is always on call at the hospital. [address] [place], [place], [place]  [address] ? [address], [place], [place]   [phone] ? [phone] ? FAX [phone] Web site: [employer] Instructions  Activity: Get plenty of rest for the remainder of the day. A responsible individual must stay with you for 24 hours following the procedure.  For the next 24 hours, DO NOT: -Drive a car -Paediatric nurse -Drink alcoholic beverages -Take any medication unless instructed by your physician -Make any legal decisions or sign important papers.  Meals: Start with liquid foods such as gelatin or soup. Progress to regular foods as tolerated. Avoid greasy, spicy, heavy foods. If nausea and/or vomiting occur, drink only clear liquids until the nausea and/or vomiting subsides. Call your physician if vomiting continues.  Special Instructions/Symptoms: Your throat may feel dry or sore from the anesthesia or the breathing tube placed in your throat during surgery. If this causes discomfort, gargle with warm salt water. The discomfort should disappear within 24 hours.  If you had a scopolamine patch placed behind your ear for the management of post- operative nausea and/or vomiting:  1. The medication in the patch is effective for 72 hours, after which it should be removed.  Wrap patch in a tissue and discard in the trash. Wash hands thoroughly with soap and water. 2. You may remove the patch earlier than 72 hours if you experience unpleasant side effects which may include dry mouth, dizziness or visual disturbances. 3. Avoid touching the patch. Wash your hands with soap and water after contact with the patch.

## 2023-08-05 NOTE — Anesthesia Preprocedure Evaluation (Addendum)
 Anesthesia Evaluation  Patient identified by MRN, date of birth, ID band Patient awake    Reviewed: Allergy & Precautions, NPO status , Patient's Chart, lab work & pertinent test results  History of Anesthesia Complications (+) PONV and history of anesthetic complications  Airway Mallampati: II  TM Distance: >3 FB Neck ROM: Full    Dental no notable dental hx.    Pulmonary neg pulmonary ROS   Pulmonary exam normal        Cardiovascular negative cardio ROS Normal cardiovascular exam     Neuro/Psych negative neurological ROS  negative psych ROS   GI/Hepatic negative GI ROS, Neg liver ROS,,,  Endo/Other  negative endocrine ROS    Renal/GU negative Renal ROS     Musculoskeletal negative musculoskeletal ROS (+)    Abdominal  (+) + obese  Peds  Hematology negative hematology ROS (+)   Anesthesia Other Findings RIGHT BREAST CANCER  Reproductive/Obstetrics                             Anesthesia Physical Anesthesia Plan  ASA: 2  Anesthesia Plan: Regional and General   Post-op Pain Management: Regional block*   Induction: Intravenous  PONV Risk Score and Plan: Ondansetron , Dexamethasone , Propofol  infusion, Midazolam , Treatment may vary due to age or medical condition and Scopolamine  patch - Pre-op  Airway Management Planned: LMA  Additional Equipment:   Intra-op Plan:   Post-operative Plan: Extubation in OR  Informed Consent: I have reviewed the patients History and Physical, chart, labs and discussed the procedure including the risks, benefits and alternatives for the proposed anesthesia with the patient or authorized representative who has indicated his/her understanding and acceptance.     Dental advisory given  Plan Discussed with: CRNA  Anesthesia Plan Comments:         Anesthesia Quick Evaluation

## 2023-08-05 NOTE — Anesthesia Postprocedure Evaluation (Signed)
 Anesthesia Post Note  Patient: Katelyn Villanueva  Procedure(s) Performed: RIGHT SIMPLE MASTECTOMY, RIGHT SENTINEL LYMPH NODE MAPPING (Right: Breast)     Patient location during evaluation: PACU Anesthesia Type: Regional and General Level of consciousness: awake Pain management: pain level controlled Vital Signs Assessment: post-procedure vital signs reviewed and stable Respiratory status: spontaneous breathing, nonlabored ventilation and respiratory function stable Cardiovascular status: blood pressure returned to baseline and stable Postop Assessment: no apparent nausea or vomiting Anesthetic complications: no   No notable events documented.  Last Vitals:  Vitals:   08/05/23 1510 08/05/23 1612  BP: 136/66 (!) 144/62  Pulse: 98 97  Resp: 18 18  Temp: 37.1 C   SpO2: 95% 98%    Last Pain:  Vitals:   08/05/23 1612  TempSrc:   PainSc: 0-No pain                 Theia Dezeeuw P Lashya Passe

## 2023-08-05 NOTE — Op Note (Signed)
 Preoperative diagnosis: Stage I right breast cancer upper outer quadrant  Postoperative diagnosis: Same  Procedure: Right simple mastectomy with right axillary sentinel lymph node mapping utilizing mag trace for deep right axillary sentinel node  Surgeon: Sim Dryer, MD  Anesthesia: LMA with pectoral block and 0.25% Marcaine  with epinephrine   EBL: Minimal  Specimen: Right breast tissue in 3 right axillary sentinel nodes  Drains: two 19 round  Indications for procedure: The patient is a 58 year old female with right breast cancer.  She has a history of right breast cancer in 2018 treated with breast conserving surgery, oral chemotherapy and radiation therapy.  She had a new lesion at the previous cancer site.  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  She opted for right mastectomy given her previous treatment.  She had no interest in reconstruction.The surgical and non surgical options have been discussed with the patient.  Risks of surgery include bleeding,  Infection,  Flap necrosis,  Tissue loss,  Chronic pain, death, Numbness,  And the need for additional procedures.  Reconstruction options also have been discussed with the patient as well.  The patient agrees to proceed. Sentinel lymph node mapping and dissection has been discussed with the patient.  Risk of bleeding,  Infection,  Seroma formation,  Additional procedures,,  Shoulder weakness ,  Shoulder stiffness,  Nerve and blood vessel injury and reaction to the mapping dyes have been discussed.  Alternatives to surgery have been discussed with the patient.  The patient agrees to proceed.    Description of procedure: The patient was brought to the operative room.  She was seen in the holding area where pectoral block was administered.  When she arrived, she was placed upon upon the operating table.  After induction of general esthesia, the right breast was prepped and 2 cc of mag trace were injected in a deep  subareolar position and massaged for 5 minutes.  Her right breast was then prepped and draped in a sterile fashion and additional timeout was performed.  She received appropriate antibiotics.  A curvilinear incision was made on the right breast.  I encompass the previous lumpectomy scar since her tumor was adjacent to that.  The fishmouth incision was used to create flaps up to the clavicle, inframammary fold and medially to the sternum.  The breast was then dissected off the chest wall and medial lateral fashion to include the pectoralis fashion until lateral attachments were identified and divided.  Specimen was passed off the field.  Flaps are viable.  The mag trace probe was used.  There is increased activity in the level 1 basin.  Identified 2 areas and had small lymph nodes noted and has some discoloration consistent with the mag trace.  These were removed.  They were grossly normal though.  The remainder of the axilla was palpated and found to be normal.  The long thoracic nerve, thoracodorsal trunk and extra vein were visualized and preserved.  Irrigation used.  A TXA soaked sponge was placed in the space and then 5 minutes observed.  Hemostasis was excellent.  Irrigation was used.  Arista was placed in axilla.  219 round drains were placed to the inferior skin flap and secured to the skin with 2-0 nylon.  The wound was closed with a deep layer 3-0 Vicryl.  4 Monocryl was used to close skin in a subcuticular fashion.  Dermabond was applied.  Breast binder placed.  All counts were correct.  The patient was awoke extubated  taken to recovery in satisfactory condition.

## 2023-08-05 NOTE — Anesthesia Procedure Notes (Signed)
 Procedure Name: LMA Insertion Date/Time: 08/05/2023 9:41 AM  Performed by: Adolphus Hoops, CRNAPre-anesthesia Checklist: Patient identified, Emergency Drugs available, Suction available and Patient being monitored Patient Re-evaluated:Patient Re-evaluated prior to induction Oxygen Delivery Method: Circle System Utilized Preoxygenation: Pre-oxygenation with 100% oxygen Induction Type: IV induction Ventilation: Mask ventilation without difficulty LMA: LMA inserted LMA Size: 4.0 Number of attempts: 1 Airway Equipment and Method: Bite block Placement Confirmation: positive ETCO2 and breath sounds checked- equal and bilateral Tube secured with: Tape Dental Injury: Teeth and Oropharynx as per pre-operative assessment

## 2023-08-06 ENCOUNTER — Encounter (HOSPITAL_BASED_OUTPATIENT_CLINIC_OR_DEPARTMENT_OTHER): Payer: Self-pay | Admitting: Surgery

## 2023-08-06 NOTE — Discharge Summary (Signed)
Physician Discharge Summary  Patient ID: Katelyn Villanueva MRN: [id number] DOB/AGE: Mar 17, 1966 58 y.o.  Admit date: 08/05/2023 Discharge date: 08/06/2023  Admission Diagnoses:right breast cancer stage 1 upper outer quadrant   Discharge Diagnoses:  Active Problems:   * No active hospital problems. *   Discharged Condition: good  Hospital Course: Pt did well without any immediate complications and was discharged home.  Consults: None    Treatments: surgery: right simple mastectomy  Discharge Exam: Blood pressure (!) 149/74, pulse 96, temperature 99 F (37.2 C), resp. rate 18, height 5\' 6"  (1.676 m), weight 99.4 kg, last menstrual period 01/19/2017, SpO2 96%. General appearance: alert and cooperative Resp: clear to auscultation bilaterally Cardio: nsr Incision/Wound: flaps viable no hematoma CDI JP serous   Disposition: Discharge disposition: 01-Home or Self Care       Discharge Instructions     Diet - low sodium heart healthy   Complete by: As directed    Increase activity slowly   Complete by: As directed          Signed: Clovis Pu [name] 08/06/2023, 8:42 AM

## 2023-08-07 ENCOUNTER — Encounter: Payer: Self-pay | Admitting: Surgery

## 2023-08-07 LAB — SURGICAL PATHOLOGY

## 2023-08-11 ENCOUNTER — Encounter: Payer: Self-pay | Admitting: *Deleted

## 2023-08-12 ENCOUNTER — Telehealth: Payer: Self-pay | Admitting: Hematology and Oncology

## 2023-08-12 NOTE — Telephone Encounter (Signed)
Scheduled appointments per scheduling message. Patient is aware of the made appointments and will be mailed an appointment reminder.

## 2023-08-17 ENCOUNTER — Encounter: Payer: Self-pay | Admitting: *Deleted

## 2023-08-26 ENCOUNTER — Other Ambulatory Visit: Payer: Self-pay | Admitting: *Deleted

## 2023-08-26 ENCOUNTER — Inpatient Hospital Stay: Payer: BC Managed Care – PPO | Attending: Hematology and Oncology | Admitting: Hematology and Oncology

## 2023-08-26 ENCOUNTER — Encounter: Payer: Self-pay | Admitting: *Deleted

## 2023-08-26 VITALS — BP 160/73 | HR 88 | Temp 98.1°F | Resp 10 | Ht 66.0 in | Wt 214.4 lb

## 2023-08-26 DIAGNOSIS — Z170 Estrogen receptor positive status [ER+]: Secondary | ICD-10-CM

## 2023-08-26 DIAGNOSIS — Z79811 Long term (current) use of aromatase inhibitors: Secondary | ICD-10-CM

## 2023-08-26 DIAGNOSIS — C50811 Malignant neoplasm of overlapping sites of right female breast: Secondary | ICD-10-CM | POA: Insufficient documentation

## 2023-08-26 DIAGNOSIS — C50411 Malignant neoplasm of upper-outer quadrant of right female breast: Secondary | ICD-10-CM | POA: Diagnosis not present

## 2023-08-26 DIAGNOSIS — Z9011 Acquired absence of right breast and nipple: Secondary | ICD-10-CM | POA: Insufficient documentation

## 2023-08-26 MED ORDER — ANASTROZOLE 1 MG PO TABS: 1.0000 mg | ORAL_TABLET | Freq: Every day | ORAL | 3 refills | Status: DC

## 2023-08-26 NOTE — Assessment & Plan Note (Signed)
 12/25/2016:Right breast biopsy 10:00 position: IDC with DCIS grade 1, ER 90%, PR 90%, Ki-67 3%, HER-2 negative ratio 1.38, screening mammogram revealed a 1.5 cm right UOQ mass, T1c N0 stage IA clinical stage 01/19/2017:Right lumpectomy: IDC grade 1, 2.1 cm, DCIS grade 1, margins negative, 0/2 lymph nodes negative, ER 90%, PR 90%, HER-2 negative, Ki-67 3%, T2 N0 M0 stage II a,   Oncotype DX score 20: Risk of recurrence 13%  Adjuvant radiation therapy completed in September 2018 Refused antiestrogen therapy --------------------------------------------------------------------------------------------------------------------------- 06/15/2023: Recurrence of right breast cancer: Mammogram detected 0.8 cm mass, axilla negative, biopsy: Grade 2 IDC ER 95%, PR 95%, HER2 2+ by IHC, FISH negative ratio 1.17, Ki-67 20%  08/05/2023:Right mastectomy: Grade 2 IDC 1.8 cm, margins negative, LVI not identified, 0/4 lymph nodes negative, ER 95%, PR 95%, HER2 2+ by IHC, FISH negative ratio 1.17, Ki67 20%  Pathology counseling: I discussed the final pathology report of the patient provided  a copy of this report. I discussed the margins as well as lymph node surgeries. We also discussed the final staging along with previously performed ER/PR and HER-2/neu testing.  Treatment plan: Adjuvant antiestrogen therapy with Anastrozole  1 mg daily X 5-7 years  Anastrozole  counseling: We discussed the risks and benefits of anti-estrogen therapy with aromatase inhibitors. These include but not limited to insomnia, hot flashes, mood changes, vaginal dryness, bone density loss, and weight gain. We strongly believe that the benefits far outweigh the risks. Patient understands these risks and consented to starting treatment. Planned treatment duration is 5-7 years.  Return to clinic in 3 months for survivorship care plan visit

## 2023-08-26 NOTE — Progress Notes (Signed)
 Patient Care Team: Pcp, No as PCP - General Causey, Laura Polio, NP as Nurse Practitioner (Hematology and Oncology) Cameron Cea, MD as Consulting Physician (Hematology and Oncology) Sim Dryer, MD as Consulting Physician (General Surgery) Auther Bo, RN as Oncology Nurse Navigator Alane Hsu, RN as Oncology Nurse Navigator  DIAGNOSIS:  Encounter Diagnosis  Name Primary?   Malignant neoplasm of upper-outer quadrant of right breast in female, estrogen receptor positive (HCC) Yes    SUMMARY OF ONCOLOGIC HISTORY: Oncology History  Malignant neoplasm of upper-outer quadrant of right breast in female, estrogen receptor positive (HCC)  12/25/2016 Initial Diagnosis   Right breast biopsy 10:00 position: IDC with DCIS grade 1, ER 90%, PR 90%, Ki-67 3%, HER-2 negative ratio 1.38, screening mammogram revealed a 1.5 cm right UOQ mass, T1c N0 stage IA clinical stage   01/19/2017 Surgery   Right lumpectomy: IDC grade 1, 2.1 cm, DCIS grade 1, margins negative, 0/2 lymph nodes negative, ER 90%, PR 90%, HER-2 negative, Ki-67 3%, T2 N0 M0 stage II a   02/04/2017 Oncotype testing   Oncotype DX score 20: 10-year risk of distant recurrence 13% with tamoxifen alone   03/17/2017 - 04/14/2017 Radiation Therapy   Adj XRT    Anti-estrogen oral therapy   Patient was offered tamoxifen therapy because she is still premenopausal.  Patient however refused it   06/15/2023 Relapse/Recurrence   Right breast biopsy: Grade 2 IDC ER 95%, PR 95%, Ki67 20%, HER2 2+ by IHC, FISH negative ratio 1.17   08/05/2023 Surgery   Right mastectomy: Grade 2 IDC 1.8 cm, margins negative, LVI not identified, 0/4 lymph nodes negative, ER 95%, PR 95%, HER2 2+ by IHC, FISH negative ratio 1.17, Ki67 20%     CHIEF COMPLIANT: Follow-up after recent mastectomy  HISTORY OF PRESENT ILLNESS:   History of Present Illness   Katelyn Villanueva is a 58 year old female with estrogen and progesterone receptor-positive  breast cancer who presents for post-surgical follow-up.  She underwent surgery for breast cancer approximately three weeks ago. Post-operatively, she experienced significant soreness and a pulling sensation, which has been her biggest problem. However, the pain has decreased, allowing her to sleep better.  The final pathology report indicated a tumor size of 1.8 centimeters, grade 2, with negative margins, and no lymphovascular invasion. Four lymph nodes were removed, all of which were negative. The cancer was estrogen and progesterone receptor-positive and HER2-negative, with a low growth rate. This information was known prior to surgery.  She is currently taking vitamin D and calcium supplements.  She has been engaging in weight-bearing exercises such as walking and using the elliptical. She has not had a bone density test yet but plans to have one soon to monitor for osteoporosis, which is a concern with her current treatment plan.         ALLERGIES:  is allergic to prednisone.  MEDICATIONS:  Current Outpatient Medications  Medication Sig Dispense Refill   anastrozole  (ARIMIDEX ) 1 MG tablet Take 1 tablet (1 mg total) by mouth daily. 90 tablet 3   oxyCODONE  (OXY IR/ROXICODONE ) 5 MG immediate release tablet Take 1 tablet (5 mg total) by mouth every 6 (six) hours as needed for severe pain (pain score 7-10). 15 tablet 0   No current facility-administered medications for this visit.    PHYSICAL EXAMINATION: ECOG PERFORMANCE STATUS: 1 - Symptomatic but completely ambulatory  Vitals:   08/26/23 1509 08/26/23 1511  BP: (!) 161/73 (!) 160/73  Pulse: 88  Resp: 10   Temp: 98.1 F (36.7 C)   SpO2: 100%    Filed Weights   08/26/23 1509  Weight: 214 lb 6.4 oz (97.3 kg)      LABORATORY DATA:  I have reviewed the data as listed    Latest Ref Rng & Units 07/28/2023   12:41 PM 01/13/2017   12:01 PM  CMP  Glucose 70 - 99 mg/dL 696  295   BUN 6 - 20 mg/dL 7  8   Creatinine 2.84 - 1.00  mg/dL 1.32  4.40   Sodium 102 - 145 mmol/L 138  136   Potassium 3.5 - 5.1 mmol/L 4.3  4.8   Chloride 98 - 111 mmol/L 104  104   CO2 22 - 32 mmol/L 25  28   Calcium 8.9 - 10.3 mg/dL 72.5  36.6   Total Protein 6.5 - 8.1 g/dL  7.1   Total Bilirubin 0.3 - 1.2 mg/dL  0.9   Alkaline Phos 38 - 126 U/L  65   AST 15 - 41 U/L  18   ALT 14 - 54 U/L  23     Lab Results  Component Value Date   WBC 6.9 01/13/2017   HGB 13.8 01/13/2017   HCT 41.5 01/13/2017   MCV 87.4 01/13/2017   PLT 239 01/13/2017   NEUTROABS 2.4 01/13/2017    ASSESSMENT & PLAN:  Malignant neoplasm of upper-outer quadrant of right breast in female, estrogen receptor positive (HCC) 12/25/2016:Right breast biopsy 10:00 position: IDC with DCIS grade 1, ER 90%, PR 90%, Ki-67 3%, HER-2 negative ratio 1.38, screening mammogram revealed a 1.5 cm right UOQ mass, T1c N0 stage IA clinical stage 01/19/2017:Right lumpectomy: IDC grade 1, 2.1 cm, DCIS grade 1, margins negative, 0/2 lymph nodes negative, ER 90%, PR 90%, HER-2 negative, Ki-67 3%, T2 N0 M0 stage II a,   Oncotype DX score 20: Risk of recurrence 13%  Adjuvant radiation therapy completed in September 2018 Refused antiestrogen therapy --------------------------------------------------------------------------------------------------------------------------- 06/15/2023: Recurrence of right breast cancer: Mammogram detected 0.8 cm mass, axilla negative, biopsy: Grade 2 IDC ER 95%, PR 95%, HER2 2+ by IHC, FISH negative ratio 1.17, Ki-67 20%  08/05/2023:Right mastectomy: Grade 2 IDC 1.8 cm, margins negative, LVI not identified, 0/4 lymph nodes negative, ER 95%, PR 95%, HER2 2+ by IHC, FISH negative ratio 1.17, Ki67 20%  Pathology counseling: I discussed the final pathology report of the patient provided  a copy of this report. I discussed the margins as well as lymph node surgeries. We also discussed the final staging along with previously performed ER/PR and HER-2/neu  testing.  Treatment plan: Adjuvant antiestrogen therapy with Anastrozole  1 mg daily X 5-7 years  Anastrozole  counseling: We discussed the risks and benefits of anti-estrogen therapy with aromatase inhibitors. These include but not limited to insomnia, hot flashes, mood changes, vaginal dryness, bone density loss, and weight gain. We strongly believe that the benefits far outweigh the risks. Patient understands these risks and consented to starting treatment. Planned treatment duration is 5-7 years.  We will request bone density test to be done at [facility].  Return to clinic in 3 months for survivorship care plan visit     No orders of the defined types were placed in this encounter.  The patient has a good understanding of the overall plan. she agrees with it. she will call with any problems that may develop before the next visit here. Total time spent: 30 mins including face to face time and  time spent for planning, charting and co-ordination of care   Viinay K Ariane Ditullio, MD 08/26/23

## 2023-08-27 ENCOUNTER — Encounter: Payer: Self-pay | Admitting: *Deleted

## 2023-08-27 NOTE — Progress Notes (Signed)
 Per MD request RN successfully faxed bone density orders to [facility] in [place] with expected date 09/30/23 [phone]).

## 2023-10-27 ENCOUNTER — Other Ambulatory Visit: Payer: Self-pay | Admitting: *Deleted

## 2023-10-27 ENCOUNTER — Telehealth: Payer: Self-pay | Admitting: *Deleted

## 2023-10-27 DIAGNOSIS — C50411 Malignant neoplasm of upper-outer quadrant of right female breast: Secondary | ICD-10-CM

## 2023-10-27 NOTE — Telephone Encounter (Signed)
 Received call from pt stating she does not wish to proceed with undergoing a bone density scan at this time due to an increased risk of radiation exposure.  Pt states she is also concerned regarding taking Calcium supplements with hx of hypercalcemia. Pt also states she is also concerned with taking OTC Vitamin D related to those supplements containing soybean. Pt scheduled for survivorship visit next month and would like to wait and discuss with NP in further details.

## 2023-11-10 ENCOUNTER — Telehealth: Payer: Self-pay | Admitting: *Deleted

## 2023-11-10 NOTE — Telephone Encounter (Signed)
 Received call from pt requesting documentation from 08/26/23 visit be corrected.  Pt states she is currently not taking Calcium supplements due to a hx of hypercalcemia.  Pt also requesting to cancel upcoming lab and survivorship appt.  Pt states she will have her CBC, CMP, and Vitamin D levels checked with PCP who is resume care of monitoring Calcium levels.  Pt also states she still has her survivorship paperwork for 2018 and does not need that appt at this time.  Pt yearly f/u app scheduled for February of 2026.

## 2023-11-24 ENCOUNTER — Other Ambulatory Visit

## 2023-11-24 ENCOUNTER — Encounter: Payer: BC Managed Care – PPO | Admitting: Adult Health

## 2023-12-04 ENCOUNTER — Encounter: Payer: Self-pay | Admitting: *Deleted

## 2024-06-01 ENCOUNTER — Other Ambulatory Visit: Payer: Self-pay | Admitting: *Deleted

## 2024-06-01 DIAGNOSIS — Z170 Estrogen receptor positive status [ER+]: Secondary | ICD-10-CM

## 2024-06-01 DIAGNOSIS — N644 Mastodynia: Secondary | ICD-10-CM

## 2024-06-01 NOTE — Progress Notes (Signed)
 Received call from pt with complaint of left breast discomfort.  Pt states when she showers she also feels several nodules in the left breast.  Pt with hx of right mastectomy due to breast cancer.  Per MD verbal orders received and placed for pt to undergo left breast diagnostic mammogram. Pt educated and verbalized understanding.

## 2024-07-18 ENCOUNTER — Encounter (HOSPITAL_COMMUNITY): Payer: Self-pay | Admitting: Emergency Medicine

## 2024-07-18 ENCOUNTER — Other Ambulatory Visit: Payer: Self-pay

## 2024-07-18 ENCOUNTER — Emergency Department (HOSPITAL_COMMUNITY)

## 2024-07-18 ENCOUNTER — Emergency Department (HOSPITAL_COMMUNITY)
Admission: EM | Admit: 2024-07-18 | Discharge: 2024-07-19 | Disposition: A | Discharge status: Home / Self Care | Attending: Emergency Medicine | Admitting: Emergency Medicine

## 2024-07-18 DIAGNOSIS — Z853 Personal history of malignant neoplasm of breast: Secondary | ICD-10-CM | POA: Insufficient documentation

## 2024-07-18 DIAGNOSIS — S39012A Strain of muscle, fascia and tendon of lower back, initial encounter: Secondary | ICD-10-CM | POA: Insufficient documentation

## 2024-07-18 DIAGNOSIS — M62830 Muscle spasm of back: Secondary | ICD-10-CM | POA: Insufficient documentation

## 2024-07-18 DIAGNOSIS — X58XXXA Exposure to other specified factors, initial encounter: Secondary | ICD-10-CM | POA: Diagnosis not present

## 2024-07-18 DIAGNOSIS — M549 Dorsalgia, unspecified: Secondary | ICD-10-CM | POA: Diagnosis present

## 2024-07-18 LAB — CBC WITH DIFFERENTIAL/PLATELET
Abs Immature Granulocytes: 0.02 K/uL (ref 0.00–0.07)
Basophils Absolute: 0.1 K/uL (ref 0.0–0.1)
Basophils Relative: 1 %
Eosinophils Absolute: 0.3 K/uL (ref 0.0–0.5)
Eosinophils Relative: 3 %
HCT: 45.5 % (ref 36.0–46.0)
Hemoglobin: 14.5 g/dL (ref 12.0–15.0)
Immature Granulocytes: 0 %
Lymphocytes Relative: 50 %
Lymphs Abs: 4.1 K/uL — ABNORMAL HIGH (ref 0.7–4.0)
MCH: 28.5 pg (ref 26.0–34.0)
MCHC: 31.9 g/dL (ref 30.0–36.0)
MCV: 89.4 fL (ref 80.0–100.0)
Monocytes Absolute: 0.7 K/uL (ref 0.1–1.0)
Monocytes Relative: 8 %
Neutro Abs: 3.1 K/uL (ref 1.7–7.7)
Neutrophils Relative %: 38 %
Platelets: 211 K/uL (ref 150–400)
RBC: 5.09 MIL/uL (ref 3.87–5.11)
RDW: 13.4 % (ref 11.5–15.5)
WBC: 8.2 K/uL (ref 4.0–10.5)
nRBC: 0 % (ref 0.0–0.2)

## 2024-07-18 LAB — URINALYSIS, ROUTINE W REFLEX MICROSCOPIC
Bacteria, UA: NONE SEEN
Bilirubin Urine: NEGATIVE
Glucose, UA: NEGATIVE mg/dL
Ketones, ur: NEGATIVE mg/dL
Leukocytes,Ua: NEGATIVE
Nitrite: NEGATIVE
Protein, ur: NEGATIVE mg/dL
Specific Gravity, Urine: 1.021 (ref 1.005–1.030)
pH: 5 (ref 5.0–8.0)

## 2024-07-18 LAB — COMPREHENSIVE METABOLIC PANEL WITH GFR
ALT: 26 U/L (ref 0–44)
AST: 27 U/L (ref 15–41)
Albumin: 4.5 g/dL (ref 3.5–5.0)
Alkaline Phosphatase: 96 U/L (ref 38–126)
Anion gap: 11 (ref 5–15)
BUN: 13 mg/dL (ref 6–20)
CO2: 26 mmol/L (ref 22–32)
Calcium: 11.4 mg/dL — ABNORMAL HIGH (ref 8.9–10.3)
Chloride: 103 mmol/L (ref 98–111)
Creatinine, Ser: 0.6 mg/dL (ref 0.44–1.00)
GFR, Estimated: >60 mL/min
Glucose, Bld: 90 mg/dL (ref 70–99)
Potassium: 4 mmol/L (ref 3.5–5.1)
Sodium: 140 mmol/L (ref 135–145)
Total Bilirubin: 0.7 mg/dL (ref 0.0–1.2)
Total Protein: 8.1 g/dL (ref 6.5–8.1)

## 2024-07-18 LAB — LIPASE, BLOOD: Lipase: 27 U/L (ref 11–51)

## 2024-07-18 NOTE — ED Provider Triage Note (Signed)
 Emergency Medicine Provider Triage Evaluation Note  Katelyn Villanueva , a 58 y.o. female  was evaluated in triage.  Pt complains of back spasm.  Endorse pain and spasm to lower back ongoing for 4 days. When pain intense she report some urinary incontinence.  No fever, chills, dysuria, hematuria, diarrhea or constipation.  Is hypertensive.  Hx of breast CA  Review of Systems  Positive: As above Negative: As above  Physical Exam  BP (!) 209/101   Pulse 92   Temp 99.2 F (37.3 C) (Oral)   Resp 16   LMP 01/19/2017   SpO2 97%  Gen:   Awake, no distress   Resp:  Normal effort  MSK:   Moves extremities without difficulty  Other:    Medical Decision Making  Medically screening exam initiated at 5:56 PM.  Appropriate orders placed.  MATTHEW PAIS was informed that the remainder of the evaluation will be completed by another provider, this initial triage assessment does not replace that evaluation, and the importance of remaining in the ED until their evaluation is complete.     Nivia Colon, PA-C 07/18/24 1758

## 2024-07-18 NOTE — ED Triage Notes (Signed)
 Patient states she is having back spasms on the right side since Saturday, she states the back pain originally started on 12/22

## 2024-07-19 ENCOUNTER — Emergency Department (HOSPITAL_COMMUNITY)

## 2024-07-19 MED ORDER — METHOCARBAMOL 500 MG PO TABS: 500.0000 mg | ORAL_TABLET | Freq: Two times a day (BID) | ORAL | 0 refills | Status: AC | PRN

## 2024-07-19 MED ORDER — METHOCARBAMOL 500 MG PO TABS
500.0000 mg | ORAL_TABLET | Freq: Once | ORAL | Status: AC
  Administered 2024-07-19: 500 mg via ORAL
  Filled 2024-07-19: qty 1

## 2024-07-19 MED ORDER — IBUPROFEN 400 MG PO TABS
400.0000 mg | ORAL_TABLET | Freq: Once | ORAL | Status: AC
  Administered 2024-07-19: 400 mg via ORAL
  Filled 2024-07-19: qty 1

## 2024-07-19 MED ORDER — OXYCODONE-ACETAMINOPHEN 5-325 MG PO TABS
1.0000 | ORAL_TABLET | Freq: Once | ORAL | Status: AC
  Administered 2024-07-19: 1 via ORAL
  Filled 2024-07-19: qty 1

## 2024-07-19 NOTE — Discharge Instructions (Signed)
 The CAT scan did not show any cause for why you would be having pain today.  Your bones look okay and you do have a kidney stone on your left kidney but nothing in your right.  Continue doing the ibuprofen  that you have been taking at home every 6 hours as needed but also you can take the muscle relaxer as needed.  You can try also over-the-counter lidocaine  patches and heat.  You can do gentle stretching but avoid any heavy lifting or excessive bending and twisting.  It is not good to get up and walk around and not just laying in bed. If you were to start having fever, inability to lift or move your leg or complete numbness of your leg you would need to return to the emergency room or see your doctor

## 2024-07-19 NOTE — ED Provider Notes (Signed)
 " [facility] AT [facility] Provider Note   CSN: [id number] Arrival date & time: 07/18/24  1342     Patient presents with: Back Pain   Katelyn Villanueva is a 58 y.o. female.   Patient is a 58 year old female with history of prior breast cancer status postmastectomy and radiation who never required chemotherapy who is presenting today with right sided back pain.  The pain started on 07/11/2024 which was 8 days prior to arrival.  She reports she woke up 1 day with the pain in her back.  Initially it was sore with movement but then on 1226 she started getting spasms in the right side of her back.  The spasms were usually triggered by certain movements or coughing or even bending certain ways.  When the spasm comes she wants to scream because it is so painful but then does relieve.  She otherwise has soreness in that area.  Today the pain radiated down to her knee.  She also reports when she gets the spasms she cannot control her bladder and will urinate on herself.  Otherwise she is able to use the bathroom regularly when she feels like she needs to.  She has been able to ambulate without difficulty.  She denies any fever.  The pain never radiates into her abdomen and she denies any dysuria, frequency or urgency.  She does have a prior history of kidney stones but feels like this is different.  No upper back pain or trauma noted.  She has been taking ibuprofen  at home and using heat.  The history is provided by the patient.  Back Pain      Prior to Admission medications  Medication Sig Start Date End Date Taking? Authorizing Provider  methocarbamol  (ROBAXIN ) 500 MG tablet Take 1 tablet (500 mg total) by mouth 2 (two) times daily as needed for muscle spasms. 07/19/24  Yes Janet Decesare, B[place], MD  anastrozole  (ARIMIDEX ) 1 MG tablet Take 1 tablet (1 mg total) by mouth daily. 08/26/23   Odean Potts, MD  oxyCODONE  (OXY IR/ROXICODONE ) 5 MG immediate release tablet Take 1  tablet (5 mg total) by mouth every 6 (six) hours as needed for severe pain (pain score 7-10). 08/05/23   Vanderbilt Ned, MD    Allergies: Prednisone    Review of Systems  Musculoskeletal:  Positive for back pain.    Updated Vital Signs BP (!) 156/88   Pulse 77   Temp 98.3 F (36.8 C)   Resp 17   LMP 01/19/2017   SpO2 97%   Physical Exam Vitals and nursing note reviewed.  Constitutional:      General: She is not in acute distress.    Appearance: She is well-developed.  HENT:     Head: Normocephalic and atraumatic.  Eyes:     Pupils: Pupils are equal, round, and reactive to light.  Cardiovascular:     Rate and Rhythm: Normal rate.     Pulses: Normal pulses.  Pulmonary:     Effort: Pulmonary effort is normal. No respiratory distress.  Abdominal:     General: Bowel sounds are normal. There is no distension.     Palpations: Abdomen is soft.     Tenderness: There is no abdominal tenderness. There is right CVA tenderness. There is no guarding or rebound.  Musculoskeletal:        General: Tenderness present. Normal range of motion.       Back:     Right lower leg: No  edema.     Left lower leg: No edema.     Comments: No edema  Skin:    General: Skin is warm and dry.     Findings: No rash.  Neurological:     Mental Status: She is alert and oriented to person, place, and time. Mental status is at baseline.     Cranial Nerves: No cranial nerve deficit.     Sensory: No sensory deficit.     Motor: No weakness.     Comments: Pt is ambulating without difficulty  Psychiatric:        Behavior: Behavior normal.     (all labs ordered are listed, but only abnormal results are displayed) Labs Reviewed  COMPREHENSIVE METABOLIC PANEL WITH GFR - Abnormal; Notable for the following components:      Result Value   Calcium 11.4 (*)    All other components within normal limits  CBC WITH DIFFERENTIAL/PLATELET - Abnormal; Notable for the following components:   Lymphs Abs 4.1 (*)     All other components within normal limits  URINALYSIS, ROUTINE W REFLEX MICROSCOPIC - Abnormal; Notable for the following components:   Hgb urine dipstick SMALL (*)    All other components within normal limits  LIPASE, BLOOD    EKG: None  Radiology: CT Renal Stone Study Result Date: 07/19/2024 EXAM: CT UROGRAM 07/19/2024 08:01:33 AM TECHNIQUE: CT of the abdomen and pelvis was performed without the administration of intravenous contrast. Multiplanar reformatted images as well as MIP urogram images are provided for review. Automated exposure control, iterative reconstruction, and/or weight based adjustment of the mA/kV was utilized to reduce the radiation dose to as low as reasonably achievable. COMPARISON: None available. CLINICAL HISTORY: Abdominal/flank pain, stone suspected. FINDINGS: LOWER CHEST: No acute abnormality. LIVER: The liver is unremarkable. GALLBLADDER AND BILE DUCTS: Status post cholecystectomy. No biliary ductal dilatation. SPLEEN: No acute abnormality. PANCREAS: No acute abnormality. ADRENAL GLANDS: No acute abnormality. KIDNEYS, URETERS AND BLADDER: Left nephrolithiasis measuring up to 4 mm. No right nephrolithiasis. No ureteroliths bilaterally. No hydroureteronephrosis. No perinephric or periureteral stranding. Urinary bladder is unremarkable. GI AND BOWEL: Stomach demonstrates no acute abnormality. No small or large bowel thickening or dilatation. The appendix is not definitely identified with no inflammatory changes in the right lower quadrant to suggest acute appendicitis. There is no bowel obstruction. PERITONEUM AND RETROPERITONEUM: No ascites. No free air. VASCULATURE: Aorta is normal in caliber. Mild atherosclerotic plaque. LYMPH NODES: No lymphadenopathy. REPRODUCTIVE ORGANS: The uterus is unremarkable. No adnexal mass. BONES AND SOFT TISSUES: No acute osseous abnormality. No focal soft tissue abnormality. IMPRESSION: 1. Nonobstructive left nephrolithiasis measuring up to 4  mm. 2. Status post cholecystectomy. Electronically signed by: Morgane Naveau MD 07/19/2024 11:13 AM EST RP Workstation: [id number]   DG Lumbar Spine Complete Result Date: 07/18/2024 CLINICAL DATA:  Spasm. EXAM: LUMBAR SPINE - COMPLETE 4+ VIEW COMPARISON:  None Available. FINDINGS: Five non-rib-bearing lumbar vertebra. Trace anterolisthesis L4 on L5. Vertebral body heights are normal. No fracture or compression deformity. Mild L4-L5 disc space narrowing. No evidence of pars defects or focal bone abnormality. Sacroiliac joints are symmetric. IMPRESSION: Mild L4-L5 disc space narrowing. Trace anterolisthesis of L4 on L5. Electronically Signed   By: Andrea Gasman M.D.   On: 07/18/2024 19:26     Procedures   Medications Ordered in the ED  oxyCODONE -acetaminophen  (PERCOCET/ROXICET) 5-325 MG per tablet 1 tablet (1 tablet Oral Given 07/19/24 0225)  methocarbamol  (ROBAXIN ) tablet 500 mg (500 mg Oral Given 07/19/24 1111)  ibuprofen  (  ADVIL ) tablet 400 mg (400 mg Oral Given 07/19/24 1110)                                    Medical Decision Making Amount and/or Complexity of Data Reviewed Labs: ordered. Decision-making details documented in ED Course. Radiology: ordered and independent interpretation performed. Decision-making details documented in ED Course.  Risk Prescription drug management.   Pt with multiple medical problems and comorbidities and presenting today with a complaint that caries a high risk for morbidity and mortality.  Here today with complaints of right sided back pain.  Concern for renal stone versus musculoskeletal cause versus sciatica.  Patient reports when she does have a intense painful spasms she will lose control of her urine but has not had persistent incontinence of urine and denies any stool incontinence.  Neurovascularly intact on my exam currently.  No fever or symptoms to suggest an epidural abscess or discitis.  No findings suggestive of cauda equina at this time.   Patient does have history of renal stones and I independently interpreted patient's labs which did show urine with hemoglobin, normal CMP, CBC and lipase.  I have independently visualized and interpreted pt's images today.  Lumbar films show no evidence of fracture or bone met.  Radiology reports mild L4-5 disc space narrowing.  CT renal study with evidence of a renal stone on the left but no other acute process.  Radiology reports a nonobstructive left nephrolithiasis up to 4 mm but no other acute findings.  All this was discussed with the patient.  Will treat supportively with muscle relaxers and anti-inflammatories.  Encouraged follow-up with PCP if symptoms or not improving for possible physical therapy.  Given return precautions.       Final diagnoses:  Muscle spasm of back  Strain of lumbar region, initial encounter    ED Discharge Orders          Ordered    methocarbamol  (ROBAXIN ) 500 MG tablet  2 times daily PRN        07/19/24 1128               Doretha Folks, MD 07/19/24 1131  "

## 2024-07-19 NOTE — ED Notes (Signed)
Written and verbal discharge instructions administered. Pt denies any questions or concerns at this time.

## 2024-08-11 ENCOUNTER — Telehealth: Payer: Self-pay

## 2024-08-11 NOTE — Telephone Encounter (Signed)
 error

## 2024-08-11 NOTE — Telephone Encounter (Signed)
 Received call from pt stating she was requesting order for bone density scan to be sent to [facility] but upon calling them RN was informed that they do not perform bone density scans and that it would have to be done at [facility]. Order faxed to them at [phone] and pt notified to anticipate call to schedule.

## 2024-08-12 ENCOUNTER — Telehealth: Payer: Self-pay

## 2024-08-12 NOTE — Telephone Encounter (Signed)
 RN attempted x1 to call pt to reschedule yearly f/u after pt LVM stating that her bone density scan had been scheduled for 2/5. RN needs to emphasize to pt importance of [facility] faxing back the scan results to [phone].

## 2024-08-21 ENCOUNTER — Other Ambulatory Visit: Payer: Self-pay | Admitting: Hematology and Oncology

## 2024-08-25 ENCOUNTER — Inpatient Hospital Stay: Admitting: Hematology and Oncology

## 2024-11-14 ENCOUNTER — Inpatient Hospital Stay: Admitting: Hematology and Oncology
